# Patient Record
Sex: Male | Born: 1955 | Race: White | Hispanic: No | Marital: Married | Smoking: Never smoker
Health system: Southern US, Community
[De-identification: ages and names within clinical notes are randomized; demographics above are authoritative.]

## PROBLEM LIST (undated history)

## (undated) DIAGNOSIS — M48062 Spinal stenosis, lumbar region with neurogenic claudication: Secondary | ICD-10-CM

## (undated) DIAGNOSIS — M1990 Unspecified osteoarthritis, unspecified site: Secondary | ICD-10-CM

## (undated) DIAGNOSIS — R519 Headache, unspecified: Secondary | ICD-10-CM

## (undated) DIAGNOSIS — R51 Headache: Secondary | ICD-10-CM

---

## 2007-06-02 ENCOUNTER — Ambulatory Visit (HOSPITAL_COMMUNITY): Admission: RE | Admit: 2007-06-02 | Discharge: 2007-06-03 | Payer: Self-pay | Admitting: Neurosurgery

## 2010-07-11 NOTE — Op Note (Signed)
NAME:  CLANCE, BAQUERO NO.:  [id number]   MEDICAL RECORD NO.:  [id number]          PATIENT TYPE:  OIB   LOCATION:  3523                         FACILITY:  [facility]   PHYSICIAN:  Hewitt Shorts, M.D.DATE OF BIRTH:  1955-10-01   DATE OF PROCEDURE:  DATE OF DISCHARGE:                               OPERATIVE REPORT   PREOPERATIVE DIAGNOSES:  1. C5-C6 diffused, C6-C7cervical disk herniation.  2. Cervical spondylosis.  3. Cervical degenerative disk disease.  4. Right cervical radiculopathy and cervical myelopathy.   POSTOPERATIVE DIAGNOSES:  1. C5-C6 diffused, C6-C7cervical disk herniation.  2. Cervical spondylosis.  3. Cervical degenerative disk disease.  4. Right cervical radiculopathy and cervical myelopathy.   PROCEDURE:  C5-C6 and C6-C7 anterior cervical decompression and  arthrodesis with allograft and theater cervical plating.   SURGEON:  Hewitt Shorts, M.D.   ASSISTANT:  Lauree Chandler, NP and Coletta Memos, M.D.   ANESTHESIA:  General endotracheal.   INDICATIONS FOR PROCEDURE:  The patient is a 55 year old man who  presented with right cervical radiculopathy and significant small disk  herniations at C5-C6 and C6-C7.  There is an area of increased signal in  the spinal cord at the C5-C6 level consistent with myelopathy and  consideration this would be a two level decompression and arthrodesis.   PROCEDURE IN DETAIL:  The patient was brought to the operating room and  placed under general endotracheal anesthesia.  The patient was placed in  10 lbs of Holter traction.  Neck was prepped with Betadine soaked  solution in standard sterile fashion.  A horizontal incision was made in  the left side of the neck.  The line of the incision was infiltrated  with local anesthetic with epinephrine.  Incision was made and carried  down through the subcutaneous tissue and platysma.  Bipolar  electrocautery was used to maintain hemostasis.  Dissection was then  carried out through an  avascular plane leaving the sternocleidomastoid,  carotid artery, jugular vein laterally and the trachea and esophagus  medially.  The ventral aspect of the vertebral column was identified and  localization taken in the C5-C6, C6-C7, intervertebral disk space was  identified.  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  Anterior osteophytic  overgrowth was carefully removed.  Then, the microscope was draped and  brought to the field to provide additional navigation, illumination, and  visualization.  The remainder of the decompression was performed using  microdissection and microsurgical technique.   Cauterized end plates of the corresponding vertebrae were removed using  microcurette along with the X-Max drill and then posterior osteophytic  overgrowth was removed at each level using the using the X-Max drill and  along with the 2-mm Kerrison punch with a thin foot plate.  There was  significant spondylitic disk herniation at each level.  This was  carefully removed.  We removed posterior longitudinal ligament and we  were able to decompress the thecal sac and spinal canal at each level.  We then examined the neural foramina and carefully removed spondylotic  overgrowth and decompressed the  exiting C6 and C7 nerve roots  bilaterally and the decompression was completed.  Hemostasis was  established with the use of Gelfoam soaked in thrombin.  We measured the  height of the intervertebral disk spaces and selected two 7-mm implants.  Each of the allograft implants were saturated with saline solution and  then positioned in the intervertebral disk space and countersunk.  We  then selected a 32-mm theater cervical plate was positioned was  positioned over the fusion concert and secured to the vertebral with 4-  mm valve like screws placing a pair of 14-mm screws at C5, single 14 mm  screw at C6, a pair of  15-mm screws at C7.  Each screw hole was drilled  intact with the screws placed in alternating fashion.  Final tightening  was performed of all 5 screws.  An x-ray was taken.  We could visualize  the screws at C5, which were in good position and bone graft at C5-C6.  The remainder of the construct was obscured by the patient's shoulder,  but under direct visualization in the operating room, we were able to  see the graft.  plate and screws, and all were in good position.  The  wound was irrigated with bacitracin solution.  Checked for hemostasis,  which was established and confirmed and then we proceeded with closure.  The platysma was closed with interrupted inverted 2-0 Vicryl sutures.  The subcutaneous tissue was closed with interrupted inverted 3-0 Vicryl  sutures.  The skin was re-approximated with Dermabond.  The procedure  was tolerated well.  Estimated blood loss was 50 mL.  Sponge and  instrument count were correct.  Following surgery, the patient was taken  out of cervical traction and placed a soft cervical collar, reversed  from the anesthetic, extubated, and transferred to the recovery room for  further care.  He was noted to be moving all 4 extremities to command.      Hewitt Shorts, M.D.  Electronically Signed     RWN/MEDQ  D:  06/02/2007  T:  06/03/2007  Job:  [id number]

## 2010-11-21 LAB — BASIC METABOLIC PANEL
BUN: 13
CO2: 26
Calcium: 8.9
Chloride: 106
Creatinine, Ser: 1.25
GFR calc Af Amer: >60
GFR calc non Af Amer: >60
Glucose, Bld: 106 — ABNORMAL HIGH
Potassium: 4.2
Sodium: 139

## 2010-11-21 LAB — CBC
HCT: 42.3
Hemoglobin: 14.8
MCHC: 35
MCV: 95.2
Platelets: 203
RBC: 4.45
RDW: 12.4
WBC: 8.1

## 2011-02-27 HISTORY — PX: CERVICAL SPINE SURGERY: SHX589

## 2017-02-26 HISTORY — PX: LUMBAR MICRODISCECTOMY: SHX99

## 2017-03-07 ENCOUNTER — Other Ambulatory Visit: Payer: Self-pay | Admitting: Neurosurgery

## 2017-03-07 DIAGNOSIS — S129XXA Fracture of neck, unspecified, initial encounter: Secondary | ICD-10-CM

## 2017-03-28 ENCOUNTER — Ambulatory Visit
Admission: RE | Admit: 2017-03-28 | Discharge: 2017-03-28 | Disposition: A | Discharge status: Home / Self Care | Payer: 59 | Source: Ambulatory Visit | Attending: Neurosurgery | Admitting: Neurosurgery

## 2017-03-28 DIAGNOSIS — S129XXA Fracture of neck, unspecified, initial encounter: Secondary | ICD-10-CM

## 2017-08-20 ENCOUNTER — Other Ambulatory Visit: Payer: Self-pay | Admitting: Neurosurgery

## 2017-08-20 DIAGNOSIS — M5416 Radiculopathy, lumbar region: Secondary | ICD-10-CM

## 2017-08-21 ENCOUNTER — Ambulatory Visit
Admission: RE | Admit: 2017-08-21 | Discharge: 2017-08-21 | Disposition: A | Discharge status: Home / Self Care | Payer: 59 | Source: Ambulatory Visit | Attending: Neurosurgery | Admitting: Neurosurgery

## 2017-08-21 DIAGNOSIS — M5416 Radiculopathy, lumbar region: Secondary | ICD-10-CM

## 2017-08-22 ENCOUNTER — Other Ambulatory Visit: Payer: Self-pay | Admitting: Neurosurgery

## 2017-08-26 ENCOUNTER — Other Ambulatory Visit: Payer: Self-pay | Admitting: Neurosurgery

## 2017-09-02 NOTE — Pre-Procedure Instructions (Signed)
Aaron Parks  09/02/2017     No Pharmacies Listed   Your procedure is scheduled on  Thursday 09/05/17  [facility]  Call this number if you have problems the morning of surgery:  [phone]   Remember:  Do not eat or drink after midnight.     Take these medicines the morning of surgery with A SIP OF WATER   7 days prior to surgery STOP taking any Aspirin(unless otherwise instructed by your surgeon), Aleve, Naproxen, Ibuprofen, Motrin, Advil, Goody's, BC's, all herbal medications, fish oil, and all vitamins   Do not wear jewelry, make-up or nail polish.  Do not wear lotions, powders, or perfumes, or deodorant.  Do not shave 48 hours prior to surgery.  Men may shave face and neck.  Do not bring valuables to the hospital.  [facility] for any belongings or valuables.  Contacts, dentures or bridgework may not be worn into surgery.  Leave your suitcase in the car.  After surgery it may be brought to your room.  For patients admitted to the hospital, discharge time will be determined by your treatment team.  Patients discharged the day of surgery will not be allowed to drive home.   Name and phone number of your driver:    Special instructions:  [name] - Preparing for Surgery  Before surgery, you can play an important role.  Because skin is not sterile, your skin needs to be as free of germs as possible.  You can reduce the number of germs on you skin by washing with CHG (chlorahexidine gluconate) soap before surgery.  CHG is an antiseptic cleaner which kills germs and bonds with the skin to continue killing germs even after washing.  Oral Hygiene is also important in reducing the risk of infection.  Remember to brush your teeth with your regular toothpaste the morning of surgery.  Please DO NOT use if you have an allergy to CHG or antibacterial soaps.  If your skin becomes reddened/irritated stop using the CHG  and inform your nurse when you arrive at Short Stay.  Do not shave (including legs and underarms) for at least 48 hours prior to the first CHG shower.  You may shave your face.  Please follow these instructions carefully:   1.  Shower with CHG Soap the night before surgery and the morning of Surgery.  2.  If you choose to wash your hair, wash your hair first as usual with your normal shampoo.  3.  After you shampoo, rinse your hair and body thoroughly to remove the shampoo. 4.  Use CHG as you would any other liquid soap.  You can apply chg directly to the skin and wash gently with a      scrungie or washcloth.           5.  Apply the CHG Soap to your body ONLY FROM THE NECK DOWN.   Do not use on open wounds or open sores. Avoid contact with your eyes, ears, mouth and genitals (private parts).  Wash genitals (private parts) with your normal soap.  6.  Wash thoroughly, paying special attention to the area where your surgery will be performed.  7.  Thoroughly rinse your body with warm water from the neck down.  8.  DO NOT shower/wash with your normal soap after using and rinsing off the CHG Soap.  9.  Pat yourself dry with a clean  towel.            10.  Wear clean pajamas.            11.  Place clean sheets on your bed the night of your first shower and do not sleep with pets.  Day of Surgery  Do not apply any lotions/deoderants the morning of surgery.   Please wear clean clothes to the hospital/surgery center. Remember to brush your teeth with toothpaste.     Please read over the following fact sheets that you were given. Pain Booklet, MRSA Information and Surgical Site Infection Prevention

## 2017-09-03 ENCOUNTER — Other Ambulatory Visit: Payer: Self-pay

## 2017-09-03 ENCOUNTER — Encounter (HOSPITAL_COMMUNITY)
Admission: RE | Admit: 2017-09-03 | Discharge: 2017-09-03 | Disposition: A | Discharge status: Home / Self Care | Payer: 59 | Source: Ambulatory Visit | Attending: Neurosurgery | Admitting: Neurosurgery

## 2017-09-03 ENCOUNTER — Encounter (HOSPITAL_COMMUNITY): Payer: Self-pay

## 2017-09-03 DIAGNOSIS — Z01812 Encounter for preprocedural laboratory examination: Secondary | ICD-10-CM

## 2017-09-03 HISTORY — DX: Unspecified osteoarthritis, unspecified site: M19.90

## 2017-09-03 LAB — COMPREHENSIVE METABOLIC PANEL
ALK PHOS: 57 U/L (ref 38–126)
ALT: 26 U/L (ref 0–44)
AST: 23 U/L (ref 15–41)
Albumin: 4 g/dL (ref 3.5–5.0)
Anion gap: 11 (ref 5–15)
BILIRUBIN TOTAL: 0.9 mg/dL (ref 0.3–1.2)
BUN: 17 mg/dL (ref 8–23)
CALCIUM: 9.2 mg/dL (ref 8.9–10.3)
CO2: 23 mmol/L (ref 22–32)
CREATININE: 0.97 mg/dL (ref 0.61–1.24)
Chloride: 108 mmol/L (ref 98–111)
GFR calc Af Amer: >60 mL/min (ref >60)
Glucose, Bld: 98 mg/dL (ref 70–99)
POTASSIUM: 4.1 mmol/L (ref 3.5–5.1)
Sodium: 142 mmol/L (ref 135–145)
TOTAL PROTEIN: 6.5 g/dL (ref 6.5–8.1)

## 2017-09-03 LAB — SURGICAL PCR SCREEN
MRSA, PCR: POSITIVE — AB
Staphylococcus aureus: POSITIVE — AB

## 2017-09-03 LAB — CBC
HCT: 46.8 % (ref 39.0–52.0)
Hemoglobin: 15.9 g/dL (ref 13.0–17.0)
MCH: 33.1 pg (ref 26.0–34.0)
MCHC: 34 g/dL (ref 30.0–36.0)
MCV: 97.5 fL (ref 78.0–100.0)
PLATELETS: 178 10*3/uL (ref 150–400)
RBC: 4.8 MIL/uL (ref 4.22–5.81)
RDW: 12.4 % (ref 11.5–15.5)
WBC: 7.4 10*3/uL (ref 4.0–10.5)

## 2017-09-03 LAB — ABO/RH: ABO/RH(D): B POS

## 2017-09-03 LAB — TYPE AND SCREEN
ABO/RH(D): B POS
Antibody Screen: NEGATIVE

## 2017-09-03 NOTE — Progress Notes (Signed)
Pt with positive MRSA nasal PCR swab.  Treat with Betadine DOS per protocol and place on contact precautions.

## 2017-09-03 NOTE — Pre-Procedure Instructions (Signed)
Aaron DenmarkJohn T Parks  09/03/2017       Your procedure is scheduled on  Thursday 09/05/17  [facility] at 530 A.M.  Call this number if you have problems the morning of surgery:  [phone]   Remember:  Do not eat or drink after midnight.     Take these medicines the morning of surgery with A SIP OF WATER: Tylenol if needed Gabapentin (Neurontin) Eye drops if needed   7 days prior to surgery STOP taking any Aspirin, Aleve, Naproxen, Ibuprofen, Motrin, Advil, Goody's, BC's, all herbal medications, fish oil, and all vitamins.   Do not wear jewelry.  Do not wear lotions, powders, or colognes, or deodorant.  Do not shave 48 hours prior to surgery.  Men may shave face and neck.  Do not bring valuables to the hospital.  [facility] is not responsible for any belongings or valuables.  Contacts, dentures or bridgework may not be worn into surgery.  Leave your suitcase in the car.  After surgery it may be brought to your room.  For patients admitted to the hospital, discharge time will be determined by your treatment team.  Patients discharged the day of surgery will not be allowed to drive home.    Special instructions:  [place]- Preparing For Surgery  Before surgery, you can play an important role. Because skin is not sterile, your skin needs to be as free of germs as possible. You can reduce the number of germs on your skin by washing with CHG (chlorahexidine gluconate) Soap before surgery.  CHG is an antiseptic cleaner which kills germs and bonds with the skin to continue killing germs even after washing.    Oral Hygiene is also important to reduce your risk of infection.  Remember - BRUSH YOUR TEETH THE MORNING OF SURGERY WITH YOUR REGULAR TOOTHPASTE  Please do not use if you have an allergy to CHG or antibacterial soaps. If your skin becomes reddened/irritated stop using the CHG.  Do not shave (including legs and underarms) for at least 48 hours  prior to first CHG shower. It is OK to shave your face.  Please follow these instructions carefully.   1. Shower the NIGHT BEFORE SURGERY and the MORNING OF SURGERY with CHG.   2. If you chose to wash your hair, wash your hair first as usual with your normal shampoo.  3. After you shampoo, rinse your hair and body thoroughly to remove the shampoo.  4. Use CHG as you would any other liquid soap. You can apply CHG directly to the skin and wash gently with a scrungie or a clean washcloth.   5. Apply the CHG Soap to your body ONLY FROM THE NECK DOWN.  Do not use on open wounds or open sores. Avoid contact with your eyes, ears, mouth and genitals (private parts). Wash Face and genitals (private parts)  with your normal soap.  6. Wash thoroughly, paying special attention to the area where your surgery will be performed.  7. Thoroughly rinse your body with warm water from the neck down.  8. DO NOT shower/wash with your normal soap after using and rinsing off the CHG Soap.  9. Pat yourself dry with a CLEAN TOWEL.  10. Wear CLEAN PAJAMAS to bed the night before surgery, wear comfortable clothes the morning of surgery  11. Place CLEAN SHEETS on your bed the night of your first shower and DO NOT SLEEP WITH PETS.    Day  of Surgery:  Do not apply any deodorants/lotions.  Please wear clean clothes to the hospital/surgery center.   Remember to brush your teeth WITH YOUR REGULAR TOOTHPASTE.      Please read over the following fact sheets that you were given.

## 2017-09-03 NOTE — Progress Notes (Signed)
PCP: Denies Cardiologist: Denies   Denies any EKG, CXR, ECHO, Stress Test, Cardiac Cath  Patient denies shortness of breath, fever, cough, and chest pain at PAT appointment.  Patient verbalized understanding of instructions provided today at the PAT appointment.  Patient asked to review instructions at home and day of surgery.

## 2017-09-05 ENCOUNTER — Inpatient Hospital Stay (HOSPITAL_COMMUNITY): Payer: 59

## 2017-09-05 ENCOUNTER — Inpatient Hospital Stay (HOSPITAL_COMMUNITY)
Admission: RE | Admit: 2017-09-05 | Discharge: 2017-09-06 | DRG: 460 | Disposition: A | Discharge status: Home / Self Care | Payer: 59 | Source: Ambulatory Visit | Attending: Neurosurgery | Admitting: Neurosurgery

## 2017-09-05 ENCOUNTER — Other Ambulatory Visit: Payer: Self-pay

## 2017-09-05 ENCOUNTER — Encounter (HOSPITAL_COMMUNITY): Payer: Self-pay | Admitting: *Deleted

## 2017-09-05 ENCOUNTER — Inpatient Hospital Stay (HOSPITAL_COMMUNITY): Payer: 59 | Admitting: Certified Registered"

## 2017-09-05 ENCOUNTER — Encounter (HOSPITAL_COMMUNITY)
Admission: RE | Disposition: A | Discharge status: Home / Self Care | Payer: Self-pay | Source: Ambulatory Visit | Attending: Neurosurgery

## 2017-09-05 DIAGNOSIS — M545 Low back pain: Secondary | ICD-10-CM | POA: Diagnosis present

## 2017-09-05 DIAGNOSIS — M4726 Other spondylosis with radiculopathy, lumbar region: Secondary | ICD-10-CM | POA: Diagnosis present

## 2017-09-05 DIAGNOSIS — M5136 Other intervertebral disc degeneration, lumbar region: Secondary | ICD-10-CM | POA: Diagnosis present

## 2017-09-05 DIAGNOSIS — M4316 Spondylolisthesis, lumbar region: Secondary | ICD-10-CM | POA: Diagnosis present

## 2017-09-05 DIAGNOSIS — M5126 Other intervertebral disc displacement, lumbar region: Secondary | ICD-10-CM | POA: Diagnosis present

## 2017-09-05 DIAGNOSIS — M48062 Spinal stenosis, lumbar region with neurogenic claudication: Principal | ICD-10-CM | POA: Diagnosis present

## 2017-09-05 DIAGNOSIS — Z419 Encounter for procedure for purposes other than remedying health state, unspecified: Secondary | ICD-10-CM

## 2017-09-05 SURGERY — POSTERIOR LUMBAR FUSION 1 LEVEL
Anesthesia: General | Site: Back

## 2017-09-05 MED ORDER — CYCLOBENZAPRINE HCL 5 MG PO TABS: 5.0000 mg | ORAL_TABLET | Freq: Three times a day (TID) | ORAL | Status: DC | PRN

## 2017-09-05 MED ORDER — SODIUM CHLORIDE 0.9% FLUSH: 3.0000 mL | INTRAVENOUS | Status: DC | PRN

## 2017-09-05 MED ORDER — DEXAMETHASONE SODIUM PHOSPHATE 10 MG/ML IJ SOLN
INTRAMUSCULAR | Status: AC
  Filled 2017-09-05: qty 1

## 2017-09-05 MED ORDER — KETOROLAC TROMETHAMINE 30 MG/ML IJ SOLN
INTRAMUSCULAR | Status: AC
  Filled 2017-09-05: qty 1

## 2017-09-05 MED ORDER — VANCOMYCIN HCL IN DEXTROSE 1-5 GM/200ML-% IV SOLN
1000.0000 mg | INTRAVENOUS | Status: AC
  Administered 2017-09-05: 1000 mg via INTRAVENOUS

## 2017-09-05 MED ORDER — FENTANYL CITRATE (PF) 100 MCG/2ML IJ SOLN
INTRAMUSCULAR | Status: DC | PRN
  Administered 2017-09-05: 150 ug via INTRAVENOUS
  Administered 2017-09-05 (×8): 50 ug via INTRAVENOUS

## 2017-09-05 MED ORDER — OXYCODONE HCL 5 MG/5ML PO SOLN: 5.0000 mg | Freq: Once | ORAL | Status: DC | PRN

## 2017-09-05 MED ORDER — 0.9 % SODIUM CHLORIDE (POUR BTL) OPTIME
TOPICAL | Status: DC | PRN
  Administered 2017-09-05: 2000 mL

## 2017-09-05 MED ORDER — HYDROXYZINE HCL 50 MG/ML IM SOLN
50.0000 mg | INTRAMUSCULAR | Status: DC | PRN
  Administered 2017-09-06: 50 mg via INTRAMUSCULAR
  Filled 2017-09-05: qty 1

## 2017-09-05 MED ORDER — KETOROLAC TROMETHAMINE 30 MG/ML IJ SOLN
30.0000 mg | Freq: Once | INTRAMUSCULAR | Status: AC
  Administered 2017-09-05: 30 mg via INTRAVENOUS

## 2017-09-05 MED ORDER — EPHEDRINE SULFATE 50 MG/ML IJ SOLN
INTRAMUSCULAR | Status: DC | PRN
  Administered 2017-09-05: 10 mg via INTRAVENOUS

## 2017-09-05 MED ORDER — LACTATED RINGERS IV SOLN
INTRAVENOUS | Status: DC | PRN
  Administered 2017-09-05 (×3): via INTRAVENOUS

## 2017-09-05 MED ORDER — HEMOSTATIC AGENTS (NO CHARGE) OPTIME
TOPICAL | Status: DC | PRN
  Administered 2017-09-05: 1

## 2017-09-05 MED ORDER — THROMBIN 20000 UNITS EX SOLR
CUTANEOUS | Status: AC
  Filled 2017-09-05: qty 20000

## 2017-09-05 MED ORDER — CHLORHEXIDINE GLUCONATE CLOTH 2 % EX PADS: 6.0000 | MEDICATED_PAD | Freq: Once | CUTANEOUS | Status: DC

## 2017-09-05 MED ORDER — LIDOCAINE-EPINEPHRINE 1 %-1:100000 IJ SOLN
INTRAMUSCULAR | Status: AC
  Filled 2017-09-05: qty 1

## 2017-09-05 MED ORDER — MENTHOL 3 MG MT LOZG: 1.0000 | LOZENGE | OROMUCOSAL | Status: DC | PRN

## 2017-09-05 MED ORDER — MIDAZOLAM HCL 5 MG/5ML IJ SOLN
INTRAMUSCULAR | Status: DC | PRN
  Administered 2017-09-05: 2 mg via INTRAVENOUS

## 2017-09-05 MED ORDER — MIDAZOLAM HCL 2 MG/2ML IJ SOLN
INTRAMUSCULAR | Status: AC
  Filled 2017-09-05: qty 2

## 2017-09-05 MED ORDER — FLEET ENEMA 7-19 GM/118ML RE ENEM: 1.0000 | ENEMA | Freq: Once | RECTAL | Status: DC | PRN

## 2017-09-05 MED ORDER — PROPOFOL 10 MG/ML IV BOLUS
INTRAVENOUS | Status: DC | PRN
  Administered 2017-09-05: 100 mg via INTRAVENOUS
  Administered 2017-09-05: 40 mg via INTRAVENOUS
  Administered 2017-09-05: 30 mg via INTRAVENOUS
  Administered 2017-09-05: 130 mg via INTRAVENOUS

## 2017-09-05 MED ORDER — HYDROXYZINE HCL 25 MG PO TABS: 50.0000 mg | ORAL_TABLET | ORAL | Status: DC | PRN

## 2017-09-05 MED ORDER — FENTANYL CITRATE (PF) 250 MCG/5ML IJ SOLN
INTRAMUSCULAR | Status: AC
  Filled 2017-09-05: qty 5

## 2017-09-05 MED ORDER — BUPIVACAINE HCL (PF) 0.5 % IJ SOLN
INTRAMUSCULAR | Status: AC
  Filled 2017-09-05: qty 30

## 2017-09-05 MED ORDER — OXYCODONE HCL 5 MG PO TABS: 5.0000 mg | ORAL_TABLET | Freq: Once | ORAL | Status: DC | PRN

## 2017-09-05 MED ORDER — FENTANYL CITRATE (PF) 100 MCG/2ML IJ SOLN
INTRAMUSCULAR | Status: DC | PRN
  Administered 2017-09-05: 100 ug via INTRAVENOUS

## 2017-09-05 MED ORDER — KETOROLAC TROMETHAMINE 30 MG/ML IJ SOLN
30.0000 mg | Freq: Four times a day (QID) | INTRAMUSCULAR | Status: DC
  Administered 2017-09-05 – 2017-09-06 (×3): 30 mg via INTRAVENOUS
  Filled 2017-09-05 (×3): qty 1

## 2017-09-05 MED ORDER — PHENYLEPHRINE HCL 10 MG/ML IJ SOLN
INTRAVENOUS | Status: DC | PRN
  Administered 2017-09-05: 30 ug/min via INTRAVENOUS

## 2017-09-05 MED ORDER — ALUM & MAG HYDROXIDE-SIMETH 200-200-20 MG/5ML PO SUSP: 30.0000 mL | Freq: Four times a day (QID) | ORAL | Status: DC | PRN

## 2017-09-05 MED ORDER — MUPIROCIN 2 % EX OINT
1.0000 "application " | TOPICAL_OINTMENT | Freq: Two times a day (BID) | CUTANEOUS | Status: DC
  Administered 2017-09-05 – 2017-09-06 (×2): 1 via NASAL
  Filled 2017-09-05: qty 22

## 2017-09-05 MED ORDER — SODIUM CHLORIDE 0.9% FLUSH
3.0000 mL | Freq: Two times a day (BID) | INTRAVENOUS | Status: DC
  Administered 2017-09-05: 3 mL via INTRAVENOUS

## 2017-09-05 MED ORDER — ONDANSETRON HCL 4 MG/2ML IJ SOLN
INTRAMUSCULAR | Status: DC | PRN
  Administered 2017-09-05: 4 mg via INTRAVENOUS

## 2017-09-05 MED ORDER — KCL IN DEXTROSE-NACL 20-5-0.45 MEQ/L-%-% IV SOLN: INTRAVENOUS | Status: DC

## 2017-09-05 MED ORDER — ALBUTEROL SULFATE HFA 108 (90 BASE) MCG/ACT IN AERS
INHALATION_SPRAY | RESPIRATORY_TRACT | Status: DC | PRN
  Administered 2017-09-05 (×4): 2 via RESPIRATORY_TRACT

## 2017-09-05 MED ORDER — FENTANYL CITRATE (PF) 100 MCG/2ML IJ SOLN
INTRAMUSCULAR | Status: AC
Start: 2017-09-05 — End: ?
  Filled 2017-09-05: qty 2

## 2017-09-05 MED ORDER — MORPHINE SULFATE (PF) 4 MG/ML IV SOLN: 4.0000 mg | INTRAVENOUS | Status: DC | PRN

## 2017-09-05 MED ORDER — SODIUM CHLORIDE 0.9 % IV SOLN
INTRAVENOUS | Status: DC | PRN
  Administered 2017-09-05: 500 mL

## 2017-09-05 MED ORDER — SUGAMMADEX SODIUM 200 MG/2ML IV SOLN
INTRAVENOUS | Status: AC
  Filled 2017-09-05: qty 2

## 2017-09-05 MED ORDER — BUPIVACAINE HCL (PF) 0.5 % IJ SOLN
INTRAMUSCULAR | Status: DC | PRN
  Administered 2017-09-05: 15 mL

## 2017-09-05 MED ORDER — CHLORHEXIDINE GLUCONATE CLOTH 2 % EX PADS
6.0000 | MEDICATED_PAD | Freq: Every day | CUTANEOUS | Status: DC
  Administered 2017-09-06: 6 via TOPICAL

## 2017-09-05 MED ORDER — SODIUM CHLORIDE 0.9 % IV SOLN: 250.0000 mL | INTRAVENOUS | Status: DC

## 2017-09-05 MED ORDER — MAGNESIUM HYDROXIDE 400 MG/5ML PO SUSP: 30.0000 mL | Freq: Every day | ORAL | Status: DC | PRN

## 2017-09-05 MED ORDER — SUCCINYLCHOLINE CHLORIDE 20 MG/ML IJ SOLN
INTRAMUSCULAR | Status: DC | PRN
  Administered 2017-09-05: 60 mg via INTRAVENOUS

## 2017-09-05 MED ORDER — HYDROCODONE-ACETAMINOPHEN 5-325 MG PO TABS
1.0000 | ORAL_TABLET | ORAL | Status: DC | PRN
  Administered 2017-09-05 – 2017-09-06 (×3): 1 via ORAL
  Filled 2017-09-05 (×4): qty 1

## 2017-09-05 MED ORDER — ONDANSETRON HCL 4 MG/2ML IJ SOLN
INTRAMUSCULAR | Status: AC
  Filled 2017-09-05: qty 2

## 2017-09-05 MED ORDER — CEFAZOLIN SODIUM-DEXTROSE 2-4 GM/100ML-% IV SOLN
2.0000 g | INTRAVENOUS | Status: AC
  Administered 2017-09-05: 2 g via INTRAVENOUS
  Filled 2017-09-05: qty 100

## 2017-09-05 MED ORDER — METHYLPREDNISOLONE ACETATE 80 MG/ML IJ SUSP
INTRAMUSCULAR | Status: DC | PRN
  Administered 2017-09-05: 80 mg

## 2017-09-05 MED ORDER — METHYLPREDNISOLONE ACETATE 80 MG/ML IJ SUSP
INTRAMUSCULAR | Status: AC
  Filled 2017-09-05: qty 1

## 2017-09-05 MED ORDER — DEXAMETHASONE SODIUM PHOSPHATE 10 MG/ML IJ SOLN
INTRAMUSCULAR | Status: DC | PRN
  Administered 2017-09-05: 10 mg via INTRAVENOUS

## 2017-09-05 MED ORDER — LIDOCAINE HCL (CARDIAC) PF 100 MG/5ML IV SOSY
PREFILLED_SYRINGE | INTRAVENOUS | Status: DC | PRN
  Administered 2017-09-05: 60 mg via INTRAVENOUS

## 2017-09-05 MED ORDER — GABAPENTIN 300 MG PO CAPS
300.0000 mg | ORAL_CAPSULE | Freq: Two times a day (BID) | ORAL | Status: DC
  Administered 2017-09-05 – 2017-09-06 (×3): 300 mg via ORAL
  Filled 2017-09-05 (×3): qty 1

## 2017-09-05 MED ORDER — LIDOCAINE-EPINEPHRINE 1 %-1:100000 IJ SOLN
INTRAMUSCULAR | Status: DC | PRN
  Administered 2017-09-05: 15 mL

## 2017-09-05 MED ORDER — HYDROMORPHONE HCL 1 MG/ML IJ SOLN: 0.2500 mg | INTRAMUSCULAR | Status: DC | PRN

## 2017-09-05 MED ORDER — ACETAMINOPHEN 325 MG PO TABS: 650.0000 mg | ORAL_TABLET | ORAL | Status: DC | PRN

## 2017-09-05 MED ORDER — PROPOFOL 10 MG/ML IV BOLUS
INTRAVENOUS | Status: AC
  Filled 2017-09-05: qty 20

## 2017-09-05 MED ORDER — ACETAMINOPHEN 650 MG RE SUPP: 650.0000 mg | RECTAL | Status: DC | PRN

## 2017-09-05 MED ORDER — ACETAMINOPHEN 10 MG/ML IV SOLN
INTRAVENOUS | Status: AC
  Filled 2017-09-05: qty 100

## 2017-09-05 MED ORDER — PHENOL 1.4 % MT LIQD: 1.0000 | OROMUCOSAL | Status: DC | PRN

## 2017-09-05 MED ORDER — VANCOMYCIN HCL IN DEXTROSE 1-5 GM/200ML-% IV SOLN
INTRAVENOUS | Status: AC
  Administered 2017-09-05: 1000 mg via INTRAVENOUS
  Filled 2017-09-05: qty 200

## 2017-09-05 MED ORDER — ACETAMINOPHEN 10 MG/ML IV SOLN
INTRAVENOUS | Status: DC | PRN
  Administered 2017-09-05: 1000 mg via INTRAVENOUS

## 2017-09-05 MED ORDER — ROCURONIUM BROMIDE 10 MG/ML (PF) SYRINGE
PREFILLED_SYRINGE | INTRAVENOUS | Status: AC
  Filled 2017-09-05: qty 10

## 2017-09-05 MED ORDER — LIDOCAINE 2% (20 MG/ML) 5 ML SYRINGE
INTRAMUSCULAR | Status: AC
  Filled 2017-09-05: qty 5

## 2017-09-05 MED ORDER — BISACODYL 10 MG RE SUPP: 10.0000 mg | Freq: Every day | RECTAL | Status: DC | PRN

## 2017-09-05 MED ORDER — THROMBIN 20000 UNITS EX SOLR
CUTANEOUS | Status: DC | PRN
  Administered 2017-09-05: 09:00:00 via TOPICAL

## 2017-09-05 MED ORDER — PROPOFOL 500 MG/50ML IV EMUL
INTRAVENOUS | Status: DC | PRN
  Administered 2017-09-05: 25 ug/kg/min via INTRAVENOUS
  Administered 2017-09-05: 11:00:00 via INTRAVENOUS

## 2017-09-05 MED FILL — Heparin Sodium (Porcine) Inj 1000 Unit/ML: INTRAMUSCULAR | Qty: 30 | Status: AC

## 2017-09-05 MED FILL — Sodium Chloride IV Soln 0.9%: INTRAVENOUS | Qty: 1000 | Status: AC

## 2017-09-05 SURGICAL SUPPLY — 72 items
BAG DECANTER FOR FLEXI CONT (MISCELLANEOUS) ×2 IMPLANT
BENZOIN TINCTURE PRP APPL 2/3 (GAUZE/BANDAGES/DRESSINGS) IMPLANT
BLADE CLIPPER SURG (BLADE) IMPLANT
BUR ACRON 5.0MM COATED (BURR) ×2 IMPLANT
BUR MATCHSTICK NEURO 3.0 LAGG (BURR) ×2 IMPLANT
CANISTER SUCT 3000ML PPV (MISCELLANEOUS) ×2 IMPLANT
CAP RELINE MOD TULIP RMM (Cap) ×8 IMPLANT
CARTRIDGE OIL MAESTRO DRILL (MISCELLANEOUS) ×1 IMPLANT
CLIP NEUROVISION LG (CLIP) ×2 IMPLANT
CONT SPEC 4OZ CLIKSEAL STRL BL (MISCELLANEOUS) ×2 IMPLANT
COVER BACK TABLE 60X90IN (DRAPES) ×2 IMPLANT
DECANTER SPIKE VIAL GLASS SM (MISCELLANEOUS) ×4 IMPLANT
DERMABOND ADVANCED (GAUZE/BANDAGES/DRESSINGS) ×1
DERMABOND ADVANCED .7 DNX12 (GAUZE/BANDAGES/DRESSINGS) ×1 IMPLANT
DIFFUSER DRILL AIR PNEUMATIC (MISCELLANEOUS) ×2 IMPLANT
DRAPE C-ARM 42X72 X-RAY (DRAPES) ×2 IMPLANT
DRAPE C-ARMOR (DRAPES) ×2 IMPLANT
DRAPE HALF SHEET 40X57 (DRAPES) ×2 IMPLANT
DRAPE LAPAROTOMY 100X72X124 (DRAPES) ×2 IMPLANT
DRAPE MICROSCOPE LEICA (MISCELLANEOUS) ×2 IMPLANT
DRAPE POUCH INSTRU U-SHP 10X18 (DRAPES) ×2 IMPLANT
ELECT REM PT RETURN 9FT ADLT (ELECTROSURGICAL) ×2
ELECTRODE REM PT RTRN 9FT ADLT (ELECTROSURGICAL) ×1 IMPLANT
GAUZE SPONGE 4X4 12PLY STRL (GAUZE/BANDAGES/DRESSINGS) ×2 IMPLANT
GAUZE SPONGE 4X4 12PLY STRL LF (GAUZE/BANDAGES/DRESSINGS) ×2 IMPLANT
GAUZE SPONGE 4X4 16PLY XRAY LF (GAUZE/BANDAGES/DRESSINGS) ×2 IMPLANT
GLOVE BIOGEL PI IND STRL 6.5 (GLOVE) ×1 IMPLANT
GLOVE BIOGEL PI IND STRL 8 (GLOVE) ×2 IMPLANT
GLOVE BIOGEL PI INDICATOR 6.5 (GLOVE) ×1
GLOVE BIOGEL PI INDICATOR 8 (GLOVE) ×2
GLOVE ECLIPSE 7.5 STRL STRAW (GLOVE) ×4 IMPLANT
GLOVE SURG SS PI 6.5 STRL IVOR (GLOVE) ×2 IMPLANT
GOWN STRL REUS W/ TWL LRG LVL3 (GOWN DISPOSABLE) ×1 IMPLANT
GOWN STRL REUS W/ TWL XL LVL3 (GOWN DISPOSABLE) ×2 IMPLANT
GOWN STRL REUS W/TWL 2XL LVL3 (GOWN DISPOSABLE) IMPLANT
GOWN STRL REUS W/TWL LRG LVL3 (GOWN DISPOSABLE) ×1
GOWN STRL REUS W/TWL XL LVL3 (GOWN DISPOSABLE) ×2
KIT BASIN OR (CUSTOM PROCEDURE TRAY) ×2 IMPLANT
KIT INFUSE XX SMALL 0.7CC (Orthopedic Implant) ×2 IMPLANT
KIT TURNOVER KIT B (KITS) ×2 IMPLANT
MODULE EMG NEEDLE SSEP NVM5 (NEEDLE) ×2 IMPLANT
MODULE NVM5 NEXT GEN EMG (NEEDLE) ×2 IMPLANT
NEEDLE ASP BONE MRW 8GX15 (NEEDLE) ×2 IMPLANT
NEEDLE HYPO 18GX1.5 BLUNT FILL (NEEDLE) ×2 IMPLANT
NEEDLE SPNL 18GX3.5 QUINCKE PK (NEEDLE) ×2 IMPLANT
NEEDLE SPNL 22GX3.5 QUINCKE BK (NEEDLE) ×2 IMPLANT
NS IRRIG 1000ML POUR BTL (IV SOLUTION) ×2 IMPLANT
OIL CARTRIDGE MAESTRO DRILL (MISCELLANEOUS) ×2
PACK LAMINECTOMY NEURO (CUSTOM PROCEDURE TRAY) ×2 IMPLANT
PAD ARMBOARD 7.5X6 YLW CONV (MISCELLANEOUS) ×6 IMPLANT
PATTIES SURGICAL .5 X.5 (GAUZE/BANDAGES/DRESSINGS) IMPLANT
PATTIES SURGICAL .5 X1 (DISPOSABLE) IMPLANT
PATTIES SURGICAL 1X1 (DISPOSABLE) IMPLANT
ROD RELINE COCR LORD 5.0X35 (Rod) ×4 IMPLANT
SCREW LOCK RSS 4.5/5.0MM (Screw) ×8 IMPLANT
SCREW SHANK RELINE MOD 5.5X30 (Screw) ×4 IMPLANT
SHANK RELINE O MOD 6.5X45 (Screw) ×4 IMPLANT
SPONGE LAP 4X18 RFD (DISPOSABLE) IMPLANT
SPONGE NEURO XRAY DETECT 1X3 (DISPOSABLE) IMPLANT
SPONGE SURGIFOAM ABS GEL 100 (HEMOSTASIS) ×2 IMPLANT
STRIP BIOACTIVE VITOSS 25X52X4 (Orthopedic Implant) ×2 IMPLANT
SUT VIC AB 1 CT1 18XBRD ANBCTR (SUTURE) ×3 IMPLANT
SUT VIC AB 1 CT1 8-18 (SUTURE) ×3
SUT VIC AB 2-0 CP2 18 (SUTURE) ×4 IMPLANT
SYR 3ML LL SCALE MARK (SYRINGE) IMPLANT
SYR 5ML LL (SYRINGE) ×2 IMPLANT
SYR CONTROL 10ML LL (SYRINGE) ×2 IMPLANT
TAPE CLOTH SURG 4X10 WHT LF (GAUZE/BANDAGES/DRESSINGS) ×2 IMPLANT
TOWEL GREEN STERILE (TOWEL DISPOSABLE) ×2 IMPLANT
TOWEL GREEN STERILE FF (TOWEL DISPOSABLE) ×2 IMPLANT
TRAY FOLEY MTR SLVR 16FR STAT (SET/KITS/TRAYS/PACK) ×2 IMPLANT
WATER STERILE IRR 1000ML POUR (IV SOLUTION) ×2 IMPLANT

## 2017-09-05 NOTE — Op Note (Signed)
09/05/2017  12:09 PM  PATIENT:  Aaron FunkJohn T Parks  62 y.o. 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; left L5 lumbar disc herniation; left lumbar radiculopathy; lumbar spondylosis; lumbar degenerative disc disease  PROCEDURE:  Procedure(s):  1) bilateral L3-4 lumbar laminectomy and medial facetectomy with decompression of severe central canal and neuroforaminal stenosis, with decompression of the exiting L3 and L4 nerve roots bilaterally; bilateral L3-4 posterior lateral arthrodesis with nonsegmental Nuvasive Reline posterior instrumentation, locally harvested morselized autograft, Vitoss BA with bone marrow aspirate, and infuse; 2) left L5 hemilaminectomy and microdiscectomy, with microdissection, microsurgical technique, and the operating microscope  SURGEON:  Surgeon(s): Shirlean KellyNudelman, Robert, MD  ANESTHESIA:   general  EBL:  Total I/O In: 2000 [I.V.:2000] Out: 750 [Urine:450; Blood:300]  BLOOD ADMINISTERED:none  CELL SAVER GIVEN: The Cell Saver was used, but there was insufficient processed blood to return to the patient.  COUNT:  Correct per nursing staff  DICTATION: Patient is brought to the operating room placed under general endotracheal anesthesia. The patient was turned to prone position the lumbar region was prepped with Betadine soap and solution and draped in a sterile fashion. The midline was infiltrated with local anesthesia with epinephrine. A localizing x-ray was taken. A midline incision was made carried down through the subcutaneous tissue, bipolar cautery and electrocautery were used to maintain hemostasis.  Dissection was carried down to the lumbar fascia.   We first performed the L3-4 decompression and stabilization.  The fascia was incised bilaterally at the L3-4 level and the paraspinal muscles were dissected with a spinous process and lamina in a subperiosteal fashion. Another x-ray was taken for localization and the L3-4 level was localized. Dissection was then carried out laterally over the L3-4 facet complex.  The decompression was begun with a bilateral laminectomy using double-action rongeurs, the high-speed drill and Kerrison punches.  We found severe canal stenosis, and carefully decompressed the thecal sac.  The dissection was carried out laterally including medial facetectomy and foraminotomies with decompression of the stenotic compression of the exiting L3 and L4 nerve roots bilaterally. Once the decompression stenotic compression of the thecal sac and exiting nerve roots was completed we proceeded with the posterior lateral arthrodesis.  The C-arm fluoroscope was then draped and brought in the field and we identified the pedicle entry points bilaterally at the L3 and L4 levels.  EMG and SSEP monitoring was used throughout the arthrodesis portion of the procedure.  Good elective diagnostic parameters were maintained throughout the instrumentation placement.  We placed cortical screws bilaterally at L3.  Each screw hole was drilled with a 5.5 mm drill, examined with a ball probe and good bony surfaces were found, tapped with a 5.5 mm tap, again examined with a ball probe and again good bony surfaces were found, and 5.5 x 30 mm shanks were placed.  We placed pedicle screws bilaterally at L4.  Each of the pedicles was probed, we did aspirate bone marrow aspirate, which was injected over a 5 cc strip of Vitoss BA.  The probed pedicles were examined with a ball probe and good bony surfaces were found, we then tapped each of the pedicles with a 6.5 mm tap, each screw hole was then again exam with a ball  probe and good bony  surfaces were found, and 6.5 x 45 mm shanks were placed bilaterally.  The L3 facet and lateral laminar surfaces and the L4 facets surfaces were decorticated.  We then secured heads to each of the shanks, and selected 35 mm pre-lordosed rods.  Locally harvested morselized autograft, Vitoss BA with bone marrow aspirate, and infuse was packed over the decorticated bony surfaces of L3 and L4.  We then placed the rods within the screw heads and then secured locking caps.  Once all 4 locking caps were placed, final tightening was performed against a counter torque.  Hemostasis in the epidural space at the L3-4 laminectomy level was established with bipolar cautery, Gelfoam with thrombin, and FloSeal.  We then performed the left L5 hemilaminectomy and microdiscectomy.  Through a separate fascial incision on the left side at the L5 level, we dissected the paraspinal musculature from the spinous process and lamina.  A localizing x-ray was taken, and the L5-S1 interlaminar space was identified.  The operating microscope was draped and brought in the field to provide additional magnification, illumination, and visualization, and the remainder of the microdiscectomy was performed using microdissection microsurgical technique.  A left L5 hemilaminectomy was performed using the high-speed drill and Kerrison punches.  The edges of the bone were waxed as needed to maintain hemostasis.  We carefully remove the ligament of flavum, and exposed the thecal sac.  The annulus of the L5-S1 disc was identified, and we explored rostrally.  We found a free fragment disc herniation near the axilla of the exiting left L5 nerve root.  We freed up the surrounding epidural tissues, and the disc herniation was removed and a piecemeal fashion, with good decompression of the thecal sac and exiting left L5 nerve root.  The epidural space was thoroughly explored, and all loose fragments of disc material were removed.  We then  establish hemostasis with the use of bipolar cautery, Gelfoam with thrombin, and FloSeal.  Once hemostasis was established, we instilled 2 cc of fentanyl and 80 mg of Depo-Medrol into the epidural space.  We then proceeded with closure.  The paraspinal muscles deep fascia and Scarpa's fascia were closed with interrupted undyed 1 Vicryl sutures the subcutaneous and subcuticular closed with interrupted inverted 2-0 undyed Vicryl sutures the skin edges were approximated with Dermabond.  The wound was dressed with sterile gauze and Hypafix  Following surgery the patient was turned back to the supine position to be reversed and the anesthetic extubated and transferred to the recovery room for further care.  PLAN OF CARE: Admit to inpatient   PATIENT DISPOSITION:  PACU - hemodynamically stable.   Delay start of Pharmacological VTE agent (>24hrs) due to surgical blood loss or risk of bleeding:  yes

## 2017-09-05 NOTE — Anesthesia Procedure Notes (Signed)
Procedure Name: Intubation Date/Time: 09/05/2017 7:53 AM Performed by: Rosiland OzMeyers, Karolee Meloni, CRNA Pre-anesthesia Checklist: Patient identified, Emergency Drugs available, Suction available, Patient being monitored and Timeout performed Patient Re-evaluated:Patient Re-evaluated prior to induction Oxygen Delivery Method: Circle system utilized Preoxygenation: Pre-oxygenation with 100% oxygen Induction Type: IV induction Ventilation: Mask ventilation without difficulty Laryngoscope Size: Miller and 3 Grade View: Grade II Tube type: Oral Tube size: 7.5 mm Number of attempts: 1 Airway Equipment and Method: Stylet Placement Confirmation: ETT inserted through vocal cords under direct vision,  positive ETCO2 and breath sounds checked- equal and bilateral Secured at: 21 cm Tube secured with: Tape Dental Injury: Teeth and Oropharynx as per pre-operative assessment

## 2017-09-05 NOTE — Progress Notes (Signed)
Vitals:   09/05/17 1415 09/05/17 1430 09/05/17 1445 09/05/17 1502  BP:  122/71  128/80  Pulse: 68 (!) 59 60 (!) 54  Resp: 16 15 16 17   Temp:      TempSrc:      SpO2: 95% 95% 96% 97%  Weight:      Height:        CBC Recent Labs    09/03/17 1042  WBC 7.4  HGB 15.9  HCT 46.8  PLT 178   BMET Recent Labs    09/03/17 1042  NA 142  K 4.1  CL 108  CO2 23  GLUCOSE 98  BUN 17  CREATININE 0.97  CALCIUM 9.2    Patient resting in bed, comfortable.  Radicular symptoms significantly improved as compared to prior to surgery.  Dressing clean and dry.  Has not yet ambulated in the halls.  Foley to straight drainage, will be removed once ambulating.  Nursing staff at that point will monitor voiding function.  Plan: Encouraged to ambulate.  Continue to progress through postoperative recovery.  Posthospitalization instructions given to patient and his wife.  Hewitt ShortsNUDELMAN,ROBERT W, MD 09/05/2017, 6:02 PM

## 2017-09-05 NOTE — Anesthesia Preprocedure Evaluation (Signed)
Anesthesia Evaluation  Patient identified by MRN, date of birth, ID band Patient awake    Reviewed: Allergy & Precautions, NPO status , Patient's Chart, lab work & pertinent test results  History of Anesthesia Complications Negative for: history of anesthetic complications  Airway Mallampati: II  TM Distance: >3 FB Neck ROM: Full    Dental  (+) Teeth Intact   Pulmonary neg pulmonary ROS,    breath sounds clear to auscultation       Cardiovascular negative cardio ROS   Rhythm:Regular     Neuro/Psych negative neurological ROS  negative psych ROS   GI/Hepatic Neg liver ROS,   Endo/Other  negative endocrine ROS  Renal/GU negative Renal ROS     Musculoskeletal  (+) Arthritis ,   Abdominal   Peds  Hematology negative hematology ROS (+)   Anesthesia Other Findings   Reproductive/Obstetrics                             Anesthesia Physical Anesthesia Plan  ASA: I  Anesthesia Plan: General   Post-op Pain Management:    Induction: Intravenous  PONV Risk Score and Plan: 2 and Ondansetron, Dexamethasone and Propofol infusion  Airway Management Planned: Oral ETT  Additional Equipment: None  Intra-op Plan:   Post-operative Plan: Extubation in OR  Informed Consent: I have reviewed the patients History and Physical, chart, labs and discussed the procedure including the risks, benefits and alternatives for the proposed anesthesia with the patient or authorized representative who has indicated his/her understanding and acceptance.   Dental advisory given  Plan Discussed with: CRNA and Surgeon  Anesthesia Plan Comments:         Anesthesia Quick Evaluation

## 2017-09-05 NOTE — Transfer of Care (Signed)
Immediate Anesthesia Transfer of Care Note  Patient: Aaron FunkJohn T Mumby  Procedure(s) Performed: LUMBAR THREE- LUMBAR FOUR LAMINECTOMY, LUMBAR THREE- LUMBAR FOUR POSTERIOR LATERAL ARTHRODESIS, LEFT LUMBAR FIVE HEMILAMINECTOMY AND MICRODISCECTOMY (N/A Back)  Patient Location: PACU  Anesthesia Type:General  Level of Consciousness: awake and patient cooperative  Airway & Oxygen Therapy: Patient Spontanous Breathing and Patient connected to face mask oxygen  Post-op Assessment: Report given to RN and Post -op Vital signs reviewed and stable  Post vital signs: Reviewed and stable  Last Vitals:  Vitals Value Taken Time  BP 124/85 09/05/2017 12:35 PM  Temp    Pulse 79 09/05/2017 12:37 PM  Resp 16 09/05/2017 12:37 PM  SpO2 98 % 09/05/2017 12:37 PM  Vitals shown include unvalidated device data.  Last Pain:  Vitals:   09/05/17 0555  TempSrc:   PainSc: 8       Patients Stated Pain Goal: 3 (09/05/17 0555)  Complications: No apparent anesthesia complications

## 2017-09-05 NOTE — H&P (Signed)
Subjective: Patient is a 62 y.o. 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  Patient has had difficulty with neurogenic claudication, with pain, numbness, and burning through the lower extremities with standing and activity for the past year which is felt to be due to severe stenosis at the L3-4 level.  More recently last month he developed acute left lumbar radicular pain, with severe pain to the left lower extremity.  He was 6 months ago and again last month with MRIs.  The study 6 months ago showed severe stenosis at L3-4, however the study last month showed the severe stenosis at L3-4 but a new free fragment disc herniation behind the left side of the L5 vertebral body.  He is admitted now for lumbar decompression and stabilization at the L3-4 level, specifically L3 and L4 laminectomy and medial facetectomy with bilateral L3-4 posterior lateral arthrodesis with posterior instrumentation including cortical screws and rods and bone graft, and a left L5 hemilaminectomy and microdiscectomy.   Past Medical History:  Diagnosis Date  . Arthritis     Past Surgical History:  Procedure Laterality Date  . CERVICAL SPINE SURGERY  2013    Medications Prior to Admission  Medication Sig Dispense Refill Last Dose  . acetaminophen (TYLENOL) 500 MG tablet Take 1,000 mg by mouth 3 (three) times daily as needed for moderate pain or headache.   Past Month at Unknown time  . gabapentin (NEURONTIN) 300 MG capsule Take 300 mg by mouth 2 (two) times daily.    09/04/2017 at Unknown time  . Tetrahydrozoline HCl (VISINE OP) Place 1 drop into both eyes daily as needed (dry eyes).   Past Month at Unknown time   No Known Allergies  Social  History   Tobacco Use  . Smoking status: Never Smoker  . Smokeless tobacco: Never Used  Substance Use Topics  . Alcohol use: Yes    Alcohol/week: 3.6 oz    Types: 6 Cans of beer per week    Comment: 1 beer a night    History reviewed. No pertinent family history.   Review of Systems Pertinent items noted in HPI and remainder of comprehensive ROS otherwise negative.  Objective: Vital signs in last 24 hours: Temp:  [98.2 F (36.8 C)] 98.2 F (36.8 C) (07/11 0540) Pulse Rate:  [58] 58 (07/11 0540) Resp:  [18] 18 (07/11 0540) BP: (145)/(96) 145/96 (07/11 0540) SpO2:  [98 %] 98 % (07/11 0540) Weight:  [79.8 kg (176 lb)] 79.8 kg (176 lb) (07/11 0604)  EXAM: Patient is well-developed well-nourished white male in no acute distress.  Lungs are clear to auscultation , the patient has symmetrical respiratory excursion. Heart has a regular rate and rhythm normal S1 and S2 no murmur.   Abdomen is soft nontender nondistended bowel sounds are present. Extremity examination shows no clubbing cyanosis or edema. Neurologic examination shows on motor exam left iliopsoas is 4+ right iliopsoas is 5.  Quadriceps and dorsiflexor 5 bilaterally.  Left EHL is 2-3, right EHL is 5.  Plantar flexors 5 bilaterally.  Sensation is intact to pinprick through the distal lower extremities.  Reflexes are 1 bilaterally, toes are downgoing.  Gait and stance are favoring the left lower extremity.  Data Review:CBC    Component Value  Date/Time   WBC 7.4 09/03/2017 1042   RBC 4.80 09/03/2017 1042   HGB 15.9 09/03/2017 1042   HCT 46.8 09/03/2017 1042   PLT 178 09/03/2017 1042   MCV 97.5 09/03/2017 1042   MCH 33.1 09/03/2017 1042   MCHC 34.0 09/03/2017 1042   RDW 12.4 09/03/2017 1042                          BMET    Component Value Date/Time   NA 142 09/03/2017 1042   K 4.1 09/03/2017 1042   CL 108 09/03/2017 1042   CO2 23 09/03/2017 1042   GLUCOSE 98 09/03/2017 1042   BUN 17 09/03/2017 1042   CREATININE  0.97 09/03/2017 1042   CALCIUM 9.2 09/03/2017 1042   GFRNONAA >60 09/03/2017 1042   GFRAA >60 09/03/2017 1042     Assessment/Plan: Patient with lumbar stenosis with neurogenic claudication with associated instability for the past year, but with an acute left lumbar radiculopathy, who is admitted now for decompression and stabilization at L3-4 and a left L5 laminectomy and microdiscectomy.  I've discussed with the patient the nature of his condition, the nature the surgical procedure, the typical length of surgery, hospital stay, and overall recuperation, the limitations postoperatively, and risks of surgery. I discussed risks including risks of infection, bleeding, possibly need for transfusion, the risk of nerve root dysfunction with pain, weakness, numbness, or paresthesias, the risk of dural tear and CSF leakage and possible need for further surgery, the risk of failure of the arthrodesis and possibly for further surgery, the risk of recurrent disc herniation and possible need for further surgery, the risk of anesthetic complications including myocardial infarction, stroke, pneumonia, and death. We discussed the need for postoperative immobilization in a lumbar brace. Understanding all this the patient does wish to proceed with surgery and is admitted for such.    Hewitt Shorts, MD 09/05/2017 6:55 AM

## 2017-09-06 MED ORDER — HYDROCODONE-ACETAMINOPHEN 5-325 MG PO TABS: 1.0000 | ORAL_TABLET | ORAL | 0 refills | Status: AC | PRN

## 2017-09-06 NOTE — Anesthesia Postprocedure Evaluation (Signed)
Anesthesia Post Note  Patient: Aaron DenmarkJohn T Parks  Procedure(s) Performed: LUMBAR THREE- LUMBAR FOUR LAMINECTOMY, LUMBAR THREE- LUMBAR FOUR POSTERIOR LATERAL ARTHRODESIS, LEFT LUMBAR FIVE HEMILAMINECTOMY AND MICRODISCECTOMY (N/A Back)     Patient location during evaluation: PACU Anesthesia Type: General Level of consciousness: awake and alert Pain management: pain level controlled Vital Signs Assessment: post-procedure vital signs reviewed and stable Respiratory status: spontaneous breathing, nonlabored ventilation, respiratory function stable and patient connected to nasal cannula oxygen Cardiovascular status: blood pressure returned to baseline and stable Postop Assessment: no apparent nausea or vomiting Anesthetic complications: no    Last Vitals:  Vitals:   09/06/17 0403 09/06/17 0742  BP: (!) 142/78 (!) 128/91  Pulse: 83 77  Resp: 15 17  Temp: 37 C 36.7 C  SpO2: 96% 99%    Last Pain:  Vitals:   09/06/17 1134  TempSrc:   PainSc: 2                  Nazim Kadlec

## 2017-09-06 NOTE — Discharge Instructions (Signed)
Diet °Resume your normal diet.  °Return to Work °Will be discussed at you follow up appointment. °Call Your Doctor If Any of These Occur °Redness, drainage, or swelling at the wound.  °Temperature greater than 101 degrees. °Severe pain not relieved by pain medication. °Incision starts to come apart. °Follow Up Appt °Call today for appointment in 3 weeks ([phone]) or for problems.  If you have any hardware placed in your spine, you will need an x-ray before your appointment. °

## 2017-09-06 NOTE — Progress Notes (Signed)
Patient alert and oriented, mae's well, voiding adequate amount of urine, swallowing without difficulty, no c/o pain at time of discharge. Patient discharged home with family. Script and discharged instructions given to patient. Patient and family stated understanding of instructions given. Patient has an appointment with Dr. Nudelman 

## 2017-09-06 NOTE — Discharge Summary (Signed)
Physician Discharge Summary  Patient ID: JEANETTE MOFFATT MRN: [id number] DOB/AGE: 03-Sep-1955 62 y.o.  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  Postoperatively he has done well.  He has had excellent relief of both his neurogenic claudication and his left lumbar radiculopathy.  He is up and ambulating actively.  He is voiding well.  His dressing was removed, and his incision is healing nicely.  He is being discharged home with instructions regarding wound care and activities.  He is scheduled for follow-up with me in the office in 3 weeks.  Discharge Exam: Blood pressure (!) 128/91, pulse 77, temperature 98.1 F (36.7 C), temperature source Oral, resp. rate 17, height 5\' 8"  (1.727 m), weight 79.8 kg (176 lb), SpO2 99 %.  Disposition: Discharge disposition: 01-Home or Self Care       Discharge Instructions    Discharge wound care:   Complete by:  As directed    Leave the wound open to air. Shower daily with the wound uncovered. Water and soapy water should run over the incision area. Do not wash directly on the incision for 2 weeks. Remove the glue after 2 weeks.   Driving Restrictions   Complete by:  As directed    No  driving for 2 weeks. May ride in the car locally now. May begin to drive locally in 2 weeks.   Other Restrictions   Complete by:  As directed    Walk gradually increasing distances out in the fresh air at least twice a day. Walking additional 6 times inside the house, gradually increasing distances, daily. No bending, lifting, or twisting. Perform activities between shoulder and waist height (that is at counter height when standing or table height when sitting).     Allergies as of 09/06/2017   No Known Allergies     Medication List    TAKE these medications   acetaminophen 500 MG tablet Commonly known as:  TYLENOL Take 1,000 mg by mouth 3 (three) times daily as needed for moderate pain or headache.   gabapentin 300 MG capsule Commonly known as:  NEURONTIN Take 300 mg by mouth 2 (two) times daily.   HYDROcodone-acetaminophen 5-325 MG tablet Commonly known as:  NORCO/VICODIN Take 1-2 tablets by mouth every 4 (four) hours as needed (pain).   VISINE OP Place 1 drop into both eyes daily as needed (dry eyes).            Discharge Care Instructions  (From admission, onward)        Start     Ordered   09/06/17 0000  Discharge wound care:    Comments:  Leave the wound open to air. Shower daily with the wound uncovered. Water and soapy water should run over the incision area. Do not wash directly on the incision for 2 weeks. Remove the glue after 2 weeks.   09/06/17 3086  SignedHewitt Shorts: NUDELMAN,ROBERT W 09/06/2017, 8:16 AM

## 2017-10-21 ENCOUNTER — Other Ambulatory Visit: Payer: Self-pay | Admitting: Orthopedic Surgery

## 2017-10-21 DIAGNOSIS — M25552 Pain in left hip: Secondary | ICD-10-CM

## 2017-10-21 DIAGNOSIS — M8700 Idiopathic aseptic necrosis of unspecified bone: Secondary | ICD-10-CM

## 2017-10-27 ENCOUNTER — Ambulatory Visit
Admission: RE | Admit: 2017-10-27 | Discharge: 2017-10-27 | Disposition: A | Discharge status: Home / Self Care | Payer: 59 | Source: Ambulatory Visit | Attending: Orthopedic Surgery | Admitting: Orthopedic Surgery

## 2017-10-27 DIAGNOSIS — M25552 Pain in left hip: Secondary | ICD-10-CM

## 2017-10-27 DIAGNOSIS — M8700 Idiopathic aseptic necrosis of unspecified bone: Secondary | ICD-10-CM

## 2017-11-28 ENCOUNTER — Other Ambulatory Visit: Payer: Self-pay | Admitting: Neurosurgery

## 2017-11-28 DIAGNOSIS — M5416 Radiculopathy, lumbar region: Secondary | ICD-10-CM

## 2017-12-01 ENCOUNTER — Ambulatory Visit
Admission: RE | Admit: 2017-12-01 | Discharge: 2017-12-01 | Disposition: A | Discharge status: Home / Self Care | Payer: 59 | Source: Ambulatory Visit | Attending: Neurosurgery | Admitting: Neurosurgery

## 2017-12-01 DIAGNOSIS — M5416 Radiculopathy, lumbar region: Secondary | ICD-10-CM

## 2017-12-01 MED ORDER — GADOBENATE DIMEGLUMINE 529 MG/ML IV SOLN
15.0000 mL | Freq: Once | INTRAVENOUS | Status: AC | PRN
  Administered 2017-12-01: 15 mL via INTRAVENOUS

## 2017-12-05 ENCOUNTER — Other Ambulatory Visit: Payer: Self-pay | Admitting: Neurosurgery

## 2017-12-11 ENCOUNTER — Other Ambulatory Visit: Payer: Self-pay | Admitting: Neurosurgery

## 2017-12-16 NOTE — Pre-Procedure Instructions (Signed)
Deaaron Fulghum Feenstra  12/16/2017      [id number] - [place], [place] - [address] [place] [address] Phone: [phone] Fax: [phone]    Your procedure is scheduled on October 28th.  Report to [facility] Admitting at 1000 A.M.  Call this number if you have problems the morning of surgery:  [phone]   Remember:  Do not eat or drink after midnight.    Take these medicines the morning of surgery with A SIP OF WATER   Tylenol (if needed)  Eye Drops (if needed)  7 days prior to surgery STOP taking any diclofenac (VOLTAREN) Aspirin(unless otherwise instructed by your surgeon), Aleve, Naproxen, Ibuprofen, Motrin, Advil, Goody's, BC's, all herbal medications, fish oil, and all vitamins     Do not wear jewelry, make-up or nail polish.  Do not wear lotions, powders, or perfumes, or deodorant.  Do not shave 48 hours prior to surgery.  Men may shave face and neck.  Do not bring valuables to the hospital.  [facility] is not responsible for any belongings or valuables.  Contacts, dentures or bridgework may not be worn into surgery.  Leave your suitcase in the car.  After surgery it may be brought to your room.  For patients admitted to the hospital, discharge time will be determined by your treatment team.  Patients discharged the day of surgery will not be allowed to drive home.    [facility]- Preparing For Surgery  Before surgery, you can play an important role. Because skin is not sterile, your skin needs to be as free of germs as possible. You can reduce the number of germs on your skin by washing with CHG (chlorahexidine gluconate) Soap before surgery.  CHG is an antiseptic cleaner which kills germs and bonds with the skin to continue killing germs even after washing.    Oral Hygiene is also important to reduce your risk of infection.  Remember - BRUSH YOUR TEETH THE MORNING OF SURGERY WITH YOUR REGULAR  TOOTHPASTE  Please do not use if you have an allergy to CHG or antibacterial soaps. If your skin becomes reddened/irritated stop using the CHG.  Do not shave (including legs and underarms) for at least 48 hours prior to first CHG shower. It is OK to shave your face.  Please follow these instructions carefully.   1. Shower the NIGHT BEFORE SURGERY and the MORNING OF SURGERY with CHG.   2. If you chose to wash your hair, wash your hair first as usual with your normal shampoo.  3. After you shampoo, rinse your hair and body thoroughly to remove the shampoo.  4. Use CHG as you would any other liquid soap. You can apply CHG directly to the skin and wash gently with a scrungie or a clean washcloth.   5. Apply the CHG Soap to your body ONLY FROM THE NECK DOWN.  Do not use on open wounds or open sores. Avoid contact with your eyes, ears, mouth and genitals (private parts). Wash Face and genitals (private parts)  with your normal soap.  6. Wash thoroughly, paying special attention to the area where your surgery will be performed.  7. Thoroughly rinse your body with warm water from the neck down.  8. DO NOT shower/wash with your normal soap after using and rinsing off the CHG Soap.  9. Pat yourself dry with a CLEAN TOWEL.  10. Wear CLEAN PAJAMAS to bed the night before surgery,  wear comfortable clothes the morning of surgery  11. Place CLEAN SHEETS on your bed the night of your first shower and DO NOT SLEEP WITH PETS.    Day of Surgery:  Do not apply any deodorants/lotions.  Please wear clean clothes to the hospital/surgery center.   Remember to brush your teeth WITH YOUR REGULAR TOOTHPASTE.    Please read over the following fact sheets that you were given.

## 2017-12-17 ENCOUNTER — Encounter (HOSPITAL_COMMUNITY): Payer: Self-pay

## 2017-12-17 ENCOUNTER — Other Ambulatory Visit: Payer: Self-pay

## 2017-12-17 ENCOUNTER — Encounter (HOSPITAL_COMMUNITY)
Admission: RE | Admit: 2017-12-17 | Discharge: 2017-12-17 | Disposition: A | Discharge status: Home / Self Care | Payer: 59 | Source: Ambulatory Visit | Attending: Neurosurgery | Admitting: Neurosurgery

## 2017-12-17 DIAGNOSIS — M5416 Radiculopathy, lumbar region: Secondary | ICD-10-CM | POA: Diagnosis not present

## 2017-12-17 DIAGNOSIS — Z01818 Encounter for other preprocedural examination: Secondary | ICD-10-CM | POA: Insufficient documentation

## 2017-12-17 HISTORY — DX: Headache: R51

## 2017-12-17 HISTORY — DX: Headache, unspecified: R51.9

## 2017-12-17 LAB — CBC
HEMATOCRIT: 47.4 % (ref 39.0–52.0)
Hemoglobin: 16.2 g/dL (ref 13.0–17.0)
MCH: 33.1 pg (ref 26.0–34.0)
MCHC: 34.2 g/dL (ref 30.0–36.0)
MCV: 96.7 fL (ref 80.0–100.0)
NRBC: 0 % (ref 0.0–0.2)
Platelets: 191 10*3/uL (ref 150–400)
RBC: 4.9 MIL/uL (ref 4.22–5.81)
RDW: 12.5 % (ref 11.5–15.5)
WBC: 7 10*3/uL (ref 4.0–10.5)

## 2017-12-17 LAB — BASIC METABOLIC PANEL
Anion gap: 7 (ref 5–15)
BUN: 16 mg/dL (ref 8–23)
CHLORIDE: 107 mmol/L (ref 98–111)
CO2: 24 mmol/L (ref 22–32)
CREATININE: 1.08 mg/dL (ref 0.61–1.24)
Calcium: 9.5 mg/dL (ref 8.9–10.3)
GFR calc Af Amer: >60 mL/min (ref >60)
GFR calc non Af Amer: >60 mL/min (ref >60)
GLUCOSE: 103 mg/dL — AB (ref 70–99)
Potassium: 4.2 mmol/L (ref 3.5–5.1)
Sodium: 138 mmol/L (ref 135–145)

## 2017-12-17 LAB — SURGICAL PCR SCREEN
MRSA, PCR: NEGATIVE
Staphylococcus aureus: NEGATIVE

## 2017-12-17 LAB — TYPE AND SCREEN
ABO/RH(D): B POS
Antibody Screen: NEGATIVE

## 2017-12-17 NOTE — Progress Notes (Signed)
PCP - No PCP per patient  Cardiologist - denies  Chest x-ray - N/A EKG - N/A Stress Test - denies ECHO - denies Cardiac Cath - denies  Sleep Study - denies  Anesthesia review: no Patient denies shortness of breath, fever, cough and chest pain at PAT appointment   Patient verbalized understanding of instructions that were given to them at the PAT appointment. Patient was also instructed that they will need to review over the PAT instructions again at home before surgery.

## 2017-12-23 ENCOUNTER — Encounter (HOSPITAL_COMMUNITY): Admission: RE | Payer: Self-pay | Source: Ambulatory Visit

## 2017-12-23 ENCOUNTER — Inpatient Hospital Stay (HOSPITAL_COMMUNITY): Admission: RE | Admit: 2017-12-23 | Payer: 59 | Source: Ambulatory Visit | Admitting: Neurosurgery

## 2017-12-23 ENCOUNTER — Other Ambulatory Visit: Payer: Self-pay | Admitting: Neurosurgery

## 2017-12-23 SURGERY — POSTERIOR LUMBAR FUSION 1 LEVEL
Anesthesia: General | Site: Back

## 2018-01-15 ENCOUNTER — Encounter (HOSPITAL_COMMUNITY): Payer: Self-pay | Admitting: *Deleted

## 2018-01-15 ENCOUNTER — Other Ambulatory Visit: Payer: Self-pay

## 2018-01-15 NOTE — Progress Notes (Signed)
Pt stated that nothing has changed since PAT appointment on 12/17/17. Pt denies SOB, chest pain, and being under the care of a cardiologist. Pt denies having a stress test, echo and cardiac cath. Pt denies having an EKG and chest x ray. Pt stated that he has stopped taking  Aspirin,vitamins, fish oil and herbal medications. Did not take any NSAIDs ie: Ibuprofen, Advil, Naproxen (Aleve), Motrin, BC and Goody Powder as indicated with previous pre-op instructions. Pt verbalized understanding of all pre-op instructions.

## 2018-01-15 NOTE — Anesthesia Preprocedure Evaluation (Addendum)
Anesthesia Evaluation  Patient identified by MRN, date of birth, ID band Patient awake    Reviewed: Allergy & Precautions, H&P , NPO status , Patient's Chart, lab work & pertinent test results  Airway Mallampati: II  TM Distance: >3 FB Neck ROM: Full    Dental  (+) Teeth Intact, Dental Advisory Given   Pulmonary neg pulmonary ROS,    breath sounds clear to auscultation       Cardiovascular Exercise Tolerance: Good negative cardio ROS   Rhythm:Regular Rate:Normal     Neuro/Psych  Headaches, negative psych ROS   GI/Hepatic negative GI ROS, (+)       marijuana use,   Endo/Other  negative endocrine ROS  Renal/GU negative Renal ROS     Musculoskeletal  (+) Arthritis ,   Abdominal   Peds  Hematology negative hematology ROS (+)   Anesthesia Other Findings   Reproductive/Obstetrics                          Lab Results  Component Value Date   WBC 7.0 12/17/2017   HGB 16.2 12/17/2017   HCT 47.4 12/17/2017   MCV 96.7 12/17/2017   PLT 191 12/17/2017    Anesthesia Physical Anesthesia Plan  ASA: I  Anesthesia Plan: General   Post-op Pain Management:    Induction: Intravenous  PONV Risk Score and Plan: 2 and Treatment may vary due to age or medical condition, Ondansetron, Dexamethasone, Propofol infusion and Scopolamine patch - Pre-op  Airway Management Planned: Oral ETT  Additional Equipment:   Intra-op Plan:   Post-operative Plan: Extubation in OR  Informed Consent: I have reviewed the patients History and Physical, chart, labs and discussed the procedure including the risks, benefits and alternatives for the proposed anesthesia with the patient or authorized representative who has indicated his/her understanding and acceptance.   Dental advisory given  Plan Discussed with: CRNA  Anesthesia Plan Comments:       Anesthesia Quick Evaluation

## 2018-01-16 ENCOUNTER — Inpatient Hospital Stay (HOSPITAL_COMMUNITY): Payer: 59

## 2018-01-16 ENCOUNTER — Inpatient Hospital Stay (HOSPITAL_COMMUNITY): Payer: 59 | Admitting: Physician Assistant

## 2018-01-16 ENCOUNTER — Encounter (HOSPITAL_COMMUNITY): Payer: Self-pay | Admitting: *Deleted

## 2018-01-16 ENCOUNTER — Inpatient Hospital Stay (HOSPITAL_COMMUNITY)
Admission: RE | Disposition: A | Discharge status: Home / Self Care | Payer: Self-pay | Source: Home / Self Care | Attending: Neurosurgery

## 2018-01-16 ENCOUNTER — Inpatient Hospital Stay (HOSPITAL_COMMUNITY)
Admission: RE | Admit: 2018-01-16 | Discharge: 2018-01-17 | DRG: 455 | Disposition: A | Discharge status: Home / Self Care | Payer: 59 | Attending: Neurosurgery | Admitting: Neurosurgery

## 2018-01-16 DIAGNOSIS — Z419 Encounter for procedure for purposes other than remedying health state, unspecified: Secondary | ICD-10-CM

## 2018-01-16 DIAGNOSIS — Z79899 Other long term (current) drug therapy: Secondary | ICD-10-CM | POA: Diagnosis not present

## 2018-01-16 DIAGNOSIS — M48062 Spinal stenosis, lumbar region with neurogenic claudication: Secondary | ICD-10-CM | POA: Diagnosis present

## 2018-01-16 DIAGNOSIS — M5126 Other intervertebral disc displacement, lumbar region: Secondary | ICD-10-CM | POA: Diagnosis present

## 2018-01-16 DIAGNOSIS — M47816 Spondylosis without myelopathy or radiculopathy, lumbar region: Secondary | ICD-10-CM | POA: Diagnosis present

## 2018-01-16 DIAGNOSIS — Z981 Arthrodesis status: Secondary | ICD-10-CM | POA: Diagnosis not present

## 2018-01-16 DIAGNOSIS — M5136 Other intervertebral disc degeneration, lumbar region: Secondary | ICD-10-CM | POA: Diagnosis present

## 2018-01-16 HISTORY — DX: Spinal stenosis, lumbar region with neurogenic claudication: M48.062

## 2018-01-16 LAB — CBC
HEMATOCRIT: 44.4 % (ref 39.0–52.0)
HEMOGLOBIN: 14.6 g/dL (ref 13.0–17.0)
MCH: 32.9 pg (ref 26.0–34.0)
MCHC: 32.9 g/dL (ref 30.0–36.0)
MCV: 100 fL (ref 80.0–100.0)
NRBC: 0 % (ref 0.0–0.2)
Platelets: 193 10*3/uL (ref 150–400)
RBC: 4.44 MIL/uL (ref 4.22–5.81)
RDW: 12.5 % (ref 11.5–15.5)
WBC: 6.9 10*3/uL (ref 4.0–10.5)

## 2018-01-16 LAB — TYPE AND SCREEN
ABO/RH(D): B POS
Antibody Screen: NEGATIVE

## 2018-01-16 SURGERY — POSTERIOR LUMBAR FUSION 1 LEVEL
Anesthesia: General | Site: Back

## 2018-01-16 MED ORDER — SCOPOLAMINE 1 MG/3DAYS TD PT72
MEDICATED_PATCH | TRANSDERMAL | Status: DC | PRN
  Administered 2018-01-16: 1 via TRANSDERMAL

## 2018-01-16 MED ORDER — PROPOFOL 10 MG/ML IV BOLUS
INTRAVENOUS | Status: DC | PRN
  Administered 2018-01-16: 20 mg via INTRAVENOUS
  Administered 2018-01-16: 180 mg via INTRAVENOUS

## 2018-01-16 MED ORDER — ACETAMINOPHEN 10 MG/ML IV SOLN
1000.0000 mg | Freq: Once | INTRAVENOUS | Status: AC
  Administered 2018-01-16: 1000 mg via INTRAVENOUS

## 2018-01-16 MED ORDER — FENTANYL CITRATE (PF) 250 MCG/5ML IJ SOLN
INTRAMUSCULAR | Status: AC
  Filled 2018-01-16: qty 5

## 2018-01-16 MED ORDER — PHENYLEPHRINE 40 MCG/ML (10ML) SYRINGE FOR IV PUSH (FOR BLOOD PRESSURE SUPPORT)
PREFILLED_SYRINGE | INTRAVENOUS | Status: AC
  Filled 2018-01-16: qty 10

## 2018-01-16 MED ORDER — ONDANSETRON HCL 4 MG/2ML IJ SOLN
INTRAMUSCULAR | Status: AC
  Filled 2018-01-16: qty 2

## 2018-01-16 MED ORDER — THROMBIN 5000 UNITS EX SOLR
CUTANEOUS | Status: AC
  Filled 2018-01-16: qty 5000

## 2018-01-16 MED ORDER — MORPHINE SULFATE (PF) 4 MG/ML IV SOLN: 4.0000 mg | INTRAVENOUS | Status: DC | PRN

## 2018-01-16 MED ORDER — LACTATED RINGERS IV SOLN
INTRAVENOUS | Status: DC | PRN
  Administered 2018-01-16 (×3): via INTRAVENOUS

## 2018-01-16 MED ORDER — SUCCINYLCHOLINE CHLORIDE 200 MG/10ML IV SOSY
PREFILLED_SYRINGE | INTRAVENOUS | Status: AC
  Filled 2018-01-16: qty 10

## 2018-01-16 MED ORDER — EPHEDRINE SULFATE-NACL 50-0.9 MG/10ML-% IV SOSY
PREFILLED_SYRINGE | INTRAVENOUS | Status: DC | PRN
  Administered 2018-01-16: 5 mg via INTRAVENOUS

## 2018-01-16 MED ORDER — PROPOFOL 500 MG/50ML IV EMUL
INTRAVENOUS | Status: DC | PRN
  Administered 2018-01-16: 50 ug/kg/min via INTRAVENOUS
  Administered 2018-01-16: 11:00:00 via INTRAVENOUS

## 2018-01-16 MED ORDER — KETOROLAC TROMETHAMINE 30 MG/ML IJ SOLN
30.0000 mg | Freq: Once | INTRAMUSCULAR | Status: AC
  Administered 2018-01-16: 30 mg via INTRAVENOUS

## 2018-01-16 MED ORDER — TAMSULOSIN HCL 0.4 MG PO CAPS
0.4000 mg | ORAL_CAPSULE | Freq: Every day | ORAL | Status: DC
  Administered 2018-01-17: 0.4 mg via ORAL
  Filled 2018-01-16: qty 1

## 2018-01-16 MED ORDER — ACETAMINOPHEN 325 MG PO TABS: 650.0000 mg | ORAL_TABLET | ORAL | Status: DC | PRN

## 2018-01-16 MED ORDER — SCOPOLAMINE 1 MG/3DAYS TD PT72
MEDICATED_PATCH | TRANSDERMAL | Status: AC
  Filled 2018-01-16: qty 1

## 2018-01-16 MED ORDER — BUPIVACAINE HCL (PF) 0.5 % IJ SOLN
INTRAMUSCULAR | Status: DC | PRN
  Administered 2018-01-16: 15 mL

## 2018-01-16 MED ORDER — FLEET ENEMA 7-19 GM/118ML RE ENEM: 1.0000 | ENEMA | Freq: Once | RECTAL | Status: DC | PRN

## 2018-01-16 MED ORDER — SODIUM CHLORIDE 0.9 % IV SOLN
INTRAVENOUS | Status: DC | PRN
  Administered 2018-01-16 (×2): 500 mL

## 2018-01-16 MED ORDER — 0.9 % SODIUM CHLORIDE (POUR BTL) OPTIME
TOPICAL | Status: DC | PRN
  Administered 2018-01-16 (×2): 1000 mL

## 2018-01-16 MED ORDER — CEFAZOLIN SODIUM 1 G IJ SOLR
INTRAMUSCULAR | Status: AC
  Filled 2018-01-16: qty 20

## 2018-01-16 MED ORDER — PROPOFOL 500 MG/50ML IV EMUL
INTRAVENOUS | Status: AC
  Filled 2018-01-16: qty 50

## 2018-01-16 MED ORDER — PHENYLEPHRINE 40 MCG/ML (10ML) SYRINGE FOR IV PUSH (FOR BLOOD PRESSURE SUPPORT)
PREFILLED_SYRINGE | INTRAVENOUS | Status: DC | PRN
  Administered 2018-01-16 (×2): 40 ug via INTRAVENOUS
  Administered 2018-01-16: 80 ug via INTRAVENOUS
  Administered 2018-01-16: 120 ug via INTRAVENOUS

## 2018-01-16 MED ORDER — SODIUM CHLORIDE 0.9 % IV SOLN: 250.0000 mL | INTRAVENOUS | Status: DC

## 2018-01-16 MED ORDER — CHLORHEXIDINE GLUCONATE CLOTH 2 % EX PADS: 6.0000 | MEDICATED_PAD | Freq: Once | CUTANEOUS | Status: DC

## 2018-01-16 MED ORDER — PROMETHAZINE HCL 25 MG/ML IJ SOLN: 6.2500 mg | INTRAMUSCULAR | Status: DC | PRN

## 2018-01-16 MED ORDER — SODIUM CHLORIDE 0.9% FLUSH
3.0000 mL | Freq: Two times a day (BID) | INTRAVENOUS | Status: DC
  Administered 2018-01-16 – 2018-01-17 (×2): 3 mL via INTRAVENOUS

## 2018-01-16 MED ORDER — DEXAMETHASONE SODIUM PHOSPHATE 10 MG/ML IJ SOLN
INTRAMUSCULAR | Status: AC
  Filled 2018-01-16: qty 1

## 2018-01-16 MED ORDER — ACETAMINOPHEN 10 MG/ML IV SOLN: 1000.0000 mg | Freq: Once | INTRAVENOUS | Status: DC | PRN

## 2018-01-16 MED ORDER — THROMBIN 5000 UNITS EX SOLR
OROMUCOSAL | Status: DC | PRN
  Administered 2018-01-16: 5 mL via TOPICAL

## 2018-01-16 MED ORDER — ONDANSETRON HCL 4 MG/2ML IJ SOLN
INTRAMUSCULAR | Status: DC | PRN
  Administered 2018-01-16: 4 mg via INTRAVENOUS

## 2018-01-16 MED ORDER — MENTHOL 3 MG MT LOZG: 1.0000 | LOZENGE | OROMUCOSAL | Status: DC | PRN

## 2018-01-16 MED ORDER — FENTANYL CITRATE (PF) 100 MCG/2ML IJ SOLN
INTRAMUSCULAR | Status: DC | PRN
  Administered 2018-01-16 (×7): 50 ug via INTRAVENOUS
  Administered 2018-01-16: 100 ug via INTRAVENOUS
  Administered 2018-01-16: 50 ug via INTRAVENOUS

## 2018-01-16 MED ORDER — MEPERIDINE HCL 50 MG/ML IJ SOLN: 6.2500 mg | INTRAMUSCULAR | Status: DC | PRN

## 2018-01-16 MED ORDER — HYDROCODONE-ACETAMINOPHEN 7.5-325 MG PO TABS: 1.0000 | ORAL_TABLET | Freq: Once | ORAL | Status: DC | PRN

## 2018-01-16 MED ORDER — LIDOCAINE-EPINEPHRINE 1 %-1:100000 IJ SOLN
INTRAMUSCULAR | Status: DC | PRN
  Administered 2018-01-16: 15 mL

## 2018-01-16 MED ORDER — TAMSULOSIN HCL 0.4 MG PO CAPS
0.8000 mg | ORAL_CAPSULE | Freq: Once | ORAL | Status: AC
  Administered 2018-01-16: 0.8 mg via ORAL
  Filled 2018-01-16: qty 2

## 2018-01-16 MED ORDER — ACETAMINOPHEN 650 MG RE SUPP: 650.0000 mg | RECTAL | Status: DC | PRN

## 2018-01-16 MED ORDER — BISACODYL 10 MG RE SUPP: 10.0000 mg | Freq: Every day | RECTAL | Status: DC | PRN

## 2018-01-16 MED ORDER — THROMBIN (RECOMBINANT) 20000 UNITS EX SOLR
CUTANEOUS | Status: AC
  Filled 2018-01-16: qty 20000

## 2018-01-16 MED ORDER — LIDOCAINE-EPINEPHRINE 1 %-1:100000 IJ SOLN
INTRAMUSCULAR | Status: AC
  Filled 2018-01-16: qty 1

## 2018-01-16 MED ORDER — BUPIVACAINE HCL (PF) 0.5 % IJ SOLN
INTRAMUSCULAR | Status: AC
  Filled 2018-01-16: qty 30

## 2018-01-16 MED ORDER — THROMBIN 20000 UNITS EX SOLR
CUTANEOUS | Status: DC | PRN
  Administered 2018-01-16: 20 mL via TOPICAL

## 2018-01-16 MED ORDER — CEFAZOLIN SODIUM-DEXTROSE 2-4 GM/100ML-% IV SOLN
2.0000 g | INTRAVENOUS | Status: AC
  Administered 2018-01-16 (×2): 2 g via INTRAVENOUS

## 2018-01-16 MED ORDER — HYDROXYZINE HCL 25 MG PO TABS: 50.0000 mg | ORAL_TABLET | ORAL | Status: DC | PRN

## 2018-01-16 MED ORDER — EPHEDRINE 5 MG/ML INJ
INTRAVENOUS | Status: AC
  Filled 2018-01-16: qty 10

## 2018-01-16 MED ORDER — KETOROLAC TROMETHAMINE 30 MG/ML IJ SOLN
30.0000 mg | Freq: Four times a day (QID) | INTRAMUSCULAR | Status: DC
  Administered 2018-01-16 – 2018-01-17 (×3): 30 mg via INTRAVENOUS
  Filled 2018-01-16 (×3): qty 1

## 2018-01-16 MED ORDER — MAGNESIUM HYDROXIDE 400 MG/5ML PO SUSP: 30.0000 mL | Freq: Every day | ORAL | Status: DC | PRN

## 2018-01-16 MED ORDER — ACETAMINOPHEN 10 MG/ML IV SOLN
INTRAVENOUS | Status: AC
  Filled 2018-01-16: qty 100

## 2018-01-16 MED ORDER — ALUM & MAG HYDROXIDE-SIMETH 200-200-20 MG/5ML PO SUSP: 30.0000 mL | Freq: Four times a day (QID) | ORAL | Status: DC | PRN

## 2018-01-16 MED ORDER — PHENOL 1.4 % MT LIQD: 1.0000 | OROMUCOSAL | Status: DC | PRN

## 2018-01-16 MED ORDER — LIDOCAINE 2% (20 MG/ML) 5 ML SYRINGE
INTRAMUSCULAR | Status: AC
  Filled 2018-01-16: qty 5

## 2018-01-16 MED ORDER — PROPOFOL 10 MG/ML IV BOLUS
INTRAVENOUS | Status: AC
  Filled 2018-01-16: qty 20

## 2018-01-16 MED ORDER — HYDROXYZINE HCL 50 MG/ML IM SOLN: 50.0000 mg | INTRAMUSCULAR | Status: DC | PRN

## 2018-01-16 MED ORDER — CEFAZOLIN SODIUM-DEXTROSE 2-4 GM/100ML-% IV SOLN
INTRAVENOUS | Status: AC
  Filled 2018-01-16: qty 100

## 2018-01-16 MED ORDER — CYCLOBENZAPRINE HCL 5 MG PO TABS: 5.0000 mg | ORAL_TABLET | Freq: Three times a day (TID) | ORAL | Status: DC | PRN

## 2018-01-16 MED ORDER — HYDROCODONE-ACETAMINOPHEN 5-325 MG PO TABS
1.0000 | ORAL_TABLET | ORAL | Status: DC | PRN
  Administered 2018-01-16: 2 via ORAL
  Administered 2018-01-17: 1 via ORAL
  Filled 2018-01-16: qty 1
  Filled 2018-01-16: qty 2

## 2018-01-16 MED ORDER — SODIUM CHLORIDE 0.9 % IV SOLN
INTRAVENOUS | Status: DC | PRN
  Administered 2018-01-16: 25 ug/min via INTRAVENOUS

## 2018-01-16 MED ORDER — DEXAMETHASONE SODIUM PHOSPHATE 10 MG/ML IJ SOLN
INTRAMUSCULAR | Status: DC | PRN
  Administered 2018-01-16: 10 mg via INTRAVENOUS

## 2018-01-16 MED ORDER — MIDAZOLAM HCL 2 MG/2ML IJ SOLN
INTRAMUSCULAR | Status: AC
  Filled 2018-01-16: qty 2

## 2018-01-16 MED ORDER — GABAPENTIN 300 MG PO CAPS
300.0000 mg | ORAL_CAPSULE | Freq: Every evening | ORAL | Status: DC
  Administered 2018-01-16: 300 mg via ORAL
  Filled 2018-01-16 (×2): qty 1

## 2018-01-16 MED ORDER — LIDOCAINE 2% (20 MG/ML) 5 ML SYRINGE
INTRAMUSCULAR | Status: DC | PRN
  Administered 2018-01-16: 100 mg via INTRAVENOUS

## 2018-01-16 MED ORDER — SUCCINYLCHOLINE CHLORIDE 20 MG/ML IJ SOLN
INTRAMUSCULAR | Status: DC | PRN
  Administered 2018-01-16: 90 mg via INTRAVENOUS

## 2018-01-16 MED ORDER — KETOROLAC TROMETHAMINE 30 MG/ML IJ SOLN
INTRAMUSCULAR | Status: AC
  Administered 2018-01-16: 30 mg
  Filled 2018-01-16: qty 1

## 2018-01-16 MED ORDER — SODIUM CHLORIDE 0.9% FLUSH: 3.0000 mL | INTRAVENOUS | Status: DC | PRN

## 2018-01-16 MED ORDER — MIDAZOLAM HCL 5 MG/5ML IJ SOLN
INTRAMUSCULAR | Status: DC | PRN
  Administered 2018-01-16: 2 mg via INTRAVENOUS

## 2018-01-16 MED ORDER — KCL IN DEXTROSE-NACL 20-5-0.45 MEQ/L-%-% IV SOLN: INTRAVENOUS | Status: DC

## 2018-01-16 MED ORDER — HYDROMORPHONE HCL 1 MG/ML IJ SOLN: 0.2500 mg | INTRAMUSCULAR | Status: DC | PRN

## 2018-01-16 MED ORDER — ALBUMIN HUMAN 5 % IV SOLN
INTRAVENOUS | Status: DC | PRN
  Administered 2018-01-16: 11:00:00 via INTRAVENOUS

## 2018-01-16 SURGICAL SUPPLY — 76 items
BAG DECANTER FOR FLEXI CONT (MISCELLANEOUS) ×4 IMPLANT
BENZOIN TINCTURE PRP APPL 2/3 (GAUZE/BANDAGES/DRESSINGS) ×2 IMPLANT
BLADE CLIPPER SURG (BLADE) IMPLANT
BUR ACRON 5.0MM COATED (BURR) ×2 IMPLANT
BUR MATCHSTICK NEURO 3.0 LAGG (BURR) ×2 IMPLANT
CAGE COROENT TI LG 11X9X28 4D (Cage) ×4 IMPLANT
CANISTER SUCT 3000ML PPV (MISCELLANEOUS) ×2 IMPLANT
CAP RELINE MOD TULIP RMM (Cap) ×6 IMPLANT
CARTRIDGE OIL MAESTRO DRILL (MISCELLANEOUS) ×1 IMPLANT
CLIP NEUROVISION LG (CLIP) ×2 IMPLANT
CONT SPEC 4OZ CLIKSEAL STRL BL (MISCELLANEOUS) ×2 IMPLANT
COVER BACK TABLE 60X90IN (DRAPES) ×2 IMPLANT
COVER WAND RF STERILE (DRAPES) ×2 IMPLANT
DECANTER SPIKE VIAL GLASS SM (MISCELLANEOUS) ×2 IMPLANT
DERMABOND ADVANCED (GAUZE/BANDAGES/DRESSINGS) ×2
DERMABOND ADVANCED .7 DNX12 (GAUZE/BANDAGES/DRESSINGS) ×2 IMPLANT
DIFFUSER DRILL AIR PNEUMATIC (MISCELLANEOUS) ×2 IMPLANT
DRAPE C-ARM 42X72 X-RAY (DRAPES) ×2 IMPLANT
DRAPE C-ARMOR (DRAPES) ×2 IMPLANT
DRAPE HALF SHEET 40X57 (DRAPES) ×2 IMPLANT
DRAPE LAPAROTOMY 100X72X124 (DRAPES) ×2 IMPLANT
DRAPE POUCH INSTRU U-SHP 10X18 (DRAPES) IMPLANT
ELECT REM PT RETURN 9FT ADLT (ELECTROSURGICAL) ×2
ELECTRODE REM PT RTRN 9FT ADLT (ELECTROSURGICAL) ×1 IMPLANT
GAUZE 4X4 16PLY RFD (DISPOSABLE) IMPLANT
GAUZE SPONGE 4X4 12PLY STRL (GAUZE/BANDAGES/DRESSINGS) IMPLANT
GAUZE SPONGE 4X4 12PLY STRL LF (GAUZE/BANDAGES/DRESSINGS) ×2 IMPLANT
GLOVE BIO SURGEON STRL SZ8 (GLOVE) ×2 IMPLANT
GLOVE BIOGEL PI IND STRL 7.0 (GLOVE) ×3 IMPLANT
GLOVE BIOGEL PI IND STRL 7.5 (GLOVE) ×3 IMPLANT
GLOVE BIOGEL PI IND STRL 8 (GLOVE) ×6 IMPLANT
GLOVE BIOGEL PI IND STRL 8.5 (GLOVE) ×1 IMPLANT
GLOVE BIOGEL PI INDICATOR 7.0 (GLOVE) ×3
GLOVE BIOGEL PI INDICATOR 7.5 (GLOVE) ×3
GLOVE BIOGEL PI INDICATOR 8 (GLOVE) ×6
GLOVE BIOGEL PI INDICATOR 8.5 (GLOVE) ×1
GLOVE ECLIPSE 7.5 STRL STRAW (GLOVE) ×14 IMPLANT
GOWN STRL REUS W/ TWL LRG LVL3 (GOWN DISPOSABLE) IMPLANT
GOWN STRL REUS W/ TWL XL LVL3 (GOWN DISPOSABLE) ×5 IMPLANT
GOWN STRL REUS W/TWL 2XL LVL3 (GOWN DISPOSABLE) ×8 IMPLANT
GOWN STRL REUS W/TWL LRG LVL3 (GOWN DISPOSABLE)
GOWN STRL REUS W/TWL XL LVL3 (GOWN DISPOSABLE) ×5
HEMOSTAT POWDER KIT SURGIFOAM (HEMOSTASIS) ×2 IMPLANT
KIT BASIN OR (CUSTOM PROCEDURE TRAY) ×2 IMPLANT
KIT INFUSE X SMALL 1.4CC (Orthopedic Implant) ×2 IMPLANT
KIT TURNOVER KIT B (KITS) ×2 IMPLANT
MODULE NVM5 NEXT GEN EMG (NEEDLE) ×2 IMPLANT
NEEDLE ASP BONE MRW 8GX15 (NEEDLE) IMPLANT
NEEDLE SPNL 18GX3.5 QUINCKE PK (NEEDLE) ×2 IMPLANT
NEEDLE SPNL 22GX3.5 QUINCKE BK (NEEDLE) ×4 IMPLANT
NS IRRIG 1000ML POUR BTL (IV SOLUTION) ×4 IMPLANT
OIL CARTRIDGE MAESTRO DRILL (MISCELLANEOUS) ×2
PACK LAMINECTOMY NEURO (CUSTOM PROCEDURE TRAY) ×2 IMPLANT
PAD ARMBOARD 7.5X6 YLW CONV (MISCELLANEOUS) ×6 IMPLANT
PATTIES SURGICAL .5 X.5 (GAUZE/BANDAGES/DRESSINGS) ×2 IMPLANT
PATTIES SURGICAL .5 X1 (DISPOSABLE) ×4 IMPLANT
PATTIES SURGICAL 1X1 (DISPOSABLE) ×2 IMPLANT
ROD LORD RELINE O 5X80 (Rod) ×4 IMPLANT
SCREW LOCK RSS 4.5/5.0MM (Screw) ×12 IMPLANT
SCREW SHANK RELINE MOD 5.0X30 (Screw) ×2 IMPLANT
SCREW SHANK RELINE MOD 5.0X35 (Screw) ×2 IMPLANT
SPONGE LAP 4X18 RFD (DISPOSABLE) IMPLANT
SPONGE NEURO XRAY DETECT 1X3 (DISPOSABLE) IMPLANT
SPONGE SURGIFOAM ABS GEL 100 (HEMOSTASIS) ×2 IMPLANT
STRIP BIOACTIVE VITOSS 25X100X (Neuro Prosthesis/Implant) ×2 IMPLANT
STRIP VITOSS 25X50X4MM (Neuro Prosthesis/Implant) ×2 IMPLANT
SUT VIC AB 1 CT1 18XBRD ANBCTR (SUTURE) ×3 IMPLANT
SUT VIC AB 1 CT1 8-18 (SUTURE) ×3
SUT VIC AB 2-0 CP2 18 (SUTURE) ×6 IMPLANT
SYR 3ML LL SCALE MARK (SYRINGE) IMPLANT
SYR CONTROL 10ML LL (SYRINGE) ×2 IMPLANT
TAPE CLOTH SURG 4X10 WHT LF (GAUZE/BANDAGES/DRESSINGS) ×2 IMPLANT
TOWEL GREEN STERILE (TOWEL DISPOSABLE) ×2 IMPLANT
TOWEL GREEN STERILE FF (TOWEL DISPOSABLE) ×2 IMPLANT
TRAY FOLEY MTR SLVR 16FR STAT (SET/KITS/TRAYS/PACK) ×2 IMPLANT
WATER STERILE IRR 1000ML POUR (IV SOLUTION) ×2 IMPLANT

## 2018-01-16 NOTE — Transfer of Care (Signed)
Immediate Anesthesia Transfer of Care Note  Patient: Kirby FunkJohn T Forton  Procedure(s) Performed: LUMBAR TWO- LUMBAR THREE DECOMPRESSION,POSTERIOR LUMBAR INTERBODY FUSION, POSTERIOR LATERAL ARTHRODESIS (N/A Back)  Patient Location: PACU  Anesthesia Type:General  Level of Consciousness: oriented, sedated and patient cooperative  Airway & Oxygen Therapy: Patient Spontanous Breathing and Patient connected to face mask oxygen  Post-op Assessment: Report given to RN and Post -op Vital signs reviewed and stable  Post vital signs: Reviewed  Last Vitals:  Vitals Value Taken Time  BP 119/76 01/16/2018  1:04 PM  Temp    Pulse 85 01/16/2018  1:04 PM  Resp 14 01/16/2018  1:04 PM  SpO2 100 % 01/16/2018  1:04 PM  Vitals shown include unvalidated device data.  Last Pain:  Vitals:   01/16/18 0637  TempSrc:   PainSc: 3          Complications: No apparent anesthesia complications

## 2018-01-16 NOTE — Progress Notes (Signed)
Vitals:   01/16/18 1414 01/16/18 1415 01/16/18 1436 01/16/18 1701  BP:  127/84 123/87 125/80  Pulse: 89 85 82 61  Resp: 13 11 16 16   Temp:  98.4 F (36.9 C) 97.8 F (36.6 C) 97.6 F (36.4 C)  TempSrc:   Oral Oral  SpO2: 99% 96% 98% 97%  Weight:      Height:        CBC Recent Labs    01/16/18 0635  WBC 6.9  HGB 14.6  HCT 44.4  PLT 193                                                           Patient resting in bed, has ambulated down the hall.  Dressing clean and dry.  Foley DC'd, no void yet, nursing staff monitoring voiding function.  Patient feels symptoms are improved following surgery.  Plan: Encouraged to ambulate.  Continue to progress through postoperative recovery.  Hewitt ShortsNUDELMAN,ROBERT W, MD 01/16/2018, 5:55 PM

## 2018-01-16 NOTE — Progress Notes (Signed)
[facility] Progress Note Patient Details:  Aaron Parks 27-Jan-1956 [id number]  Patient ID: Aaron FunkJohn T Frix, male   DOB: 27-Jan-1956, 62 y.o.   MRN: [id number]   Saul FordyceJennifer C Aya Geisel 01/16/2018, 3:02 PMCalled [employer] for Quick draw.

## 2018-01-16 NOTE — Op Note (Signed)
01/16/2018  1:02 PM  PATIENT:  Aaron Parks  62 y.o. male  PRE-OPERATIVE DIAGNOSIS: Lumbar stenosis with neurogenic claudication; L2-3 lumbar disc herniation; lumbar spondylosis; lumbar degenerative disc disease  POST-OPERATIVE DIAGNOSIS:  Lumbar stenosis with neurogenic claudication; L2-3 lumbar disc herniation; lumbar spondylosis; lumbar degenerative disc disease  PROCEDURE:  Procedure(s): Exposure of existing L3-4 posterior instrumentation with partial removal (removal of locking caps and rods); bilateral L2-3 lumbar decompression including laminectomy, facetectomy, and foraminotomies for the exiting L2 and L3 nerve roots bilaterally with bilateral L2-3 microdiscectomy, with decompression beyond that required for interbody arthrodesis; bilateral L2-3 posterior lumbar interbody arthrodesis with Nuvasive MAS PLIF interbody titanium implants, Vitoss BA, and infuse; bilateral L2-3 posterior lateral arthrodesis with segmental posterior instrumentation, with instrumentation at the L2-3 level tied into the existing instrumentation at the L3-4 level, Nuvasive reline MAS midline screws (shanks and heads) and rods, Vitoss BA, and infuse  SURGEON: Shirlean Kelly, MD  ASSISTANTS: Maeola Harman, MD  ANESTHESIA:   general  EBL:  Total I/O In: 2900 [I.V.:2500; Blood:150; IV Piggyback:250] Out: 885 [Urine:435; Blood:450]  BLOOD ADMINISTERED:150 CC CELLSAVER  COUNT:  Correct per nursing staff  DICTATION: Patient is brought to the operating room placed under general endotracheal anesthesia. The patient was turned to prone position the lumbar region was prepped with Betadine soap and solution and draped in a sterile fashion. The midline was infiltrated with local anesthesia with epinephrine. A localizing x-ray was taken and then a midline incision was made through the existing midline scar, and was carried down through the subcutaneous tissue, bipolar cautery and electrocautery were used to maintain  hemostasis. Dissection was carried down to the lumbar fascia. The fascia was incised bilaterally and the paraspinal muscles were dissected with a spinous process and lamina in a subperiosteal fashion. Another x-ray was taken for localization and the L2-3 level was identified.  We then exposed the existing posterior instrumentation at the L3-4 level.  It was cleared of overlying scar tissue.  The existing locking caps and rods were removed.  The dissection was then carried out laterally over the L2-3 facet complexes and the transverse processes of L2 and L3 were exposed and decorticated.  The C-arm fluoroscope was draped and brought in the field and cortical shanks were placed at the L2 level bilaterally with fluoroscopic guidance and continuous EMG monitoring.  Entry points were identified, and then we drilled through the pedicle and lateral vertebral body and a superior, lateral, ventral trajectory.  Each drill hole was examined with a ball probe and good bony surfaces were found.  Each drill hole was then tapped, again exam with a ball probe and again found to have good bony surfaces and good threading, and we placed a 5.0 x 30 mm shank on the left and a 5.0 x 35 mm shank on the right.  We then proceeded with the decompression.  An L2 and L3 decompressive lumbar laminectomy was performed using the high-speed drill and Kerrison punches. Dissection was carried out laterally including facetectomy and foraminotomies with decompression of the stenotic compression of the L2 and L3 nerve roots. Once the decompression of the stenotic compression of the thecal sac and exiting nerve roots was completed we proceeded with the posterior lumbar interbody arthrodesis. The annulus was incised bilaterally and the disc space entered. A thorough discectomy was performed using pituitary rongeurs and curettes. Once the discectomy was completed we began to prepare the endplate surfaces removing the cartilaginous endplates surface. We  then measured the height of  the intervertebral disc space. We selected 11 x 9 x 28 x 4 Nuvasive MAS PLIF interbody titanium implants.  They were packed with a combination of Vitoss BA and infuse.  We placed the first implant and on the right side, carefully retracting the thecal sac and nerve root medially. We then went back to the left side and packed the midline with additional Vitoss BA with bone marrow aspirate and infuse, and then placed a second implant and on the left side again retracting the thecal sac and nerve root medially. Additional Vitoss BA with bone marrow aspirate was packed lateral to the implants..  We then secured the heads onto the shanks and selected 5.0 x 80 mm cobalt chrome pre-lordosed rods.  They were further shaped with a rod bender, and placed within the screw heads.  There was secured with 6 locking caps.  Once all 6 locking caps were in place, final tightening was performed against a counter torque.  We then packed the lateral gutter over the transverse processes and intertransverse space with Vitoss BA with bone marrow aspirate and infuse.   The wound had been irrigated multiple times during the procedure with saline solution and bacitracin solution, good hemostasis was established with a combination of bipolar cautery and Gelfoam with thrombin. Once good hemostasis was confirmed we proceeded with closure paraspinal muscles deep fascia and Scarpa's fascia were closed with interrupted undyed 1 Vicryl sutures the subcutaneous and subcuticular closed with interrupted inverted 2-0 undyed Vicryl sutures the skin edges were approximated with Dermabond.  A dressing of sterile gauze and Hypafix was applied.  Following surgery the patient was turned back to the supine position to be reversed and the anesthetic extubated and transferred to the recovery room for further care.  PLAN OF CARE: Admit to inpatient   PATIENT DISPOSITION:  PACU - hemodynamically stable.   Delay start of  Pharmacological VTE agent (>24hrs) due to surgical blood loss or risk of bleeding:  yes

## 2018-01-16 NOTE — H&P (Signed)
Subjective: Patient is a 62 y.o. right handed white male who is admitted for treatment of L2-3 lumbar disc herniation with marked stenosis.  He is a little over 4 months status post a L3-4 lumbar decompression and posterior lateral arthrodesis as well as a left L5 hemilaminectomy microdiscectomy for free fragment disc herniation.  He was doing well following surgery when he caught his brace on a door and was jerked and twisted and he developed pain in the medial left groin and proximal medial left thigh and subsequently numbness in the left buttock, around the anus and perineum, as well as in the left testicle and left side of his penis.  He was restudied with MRI scan which showed good decompression at the L3-4 and L4-5 levels, but worsened disc herniation and stenosis at the L2-3 level.  He continues to have numbness in the left buttock and around the anus and perineum, as well as the left testicle and left side of the penis.  He describes some difficulty with voiding.  He is admitted now for L2-3 decompression including laminectomy, facetectomy, and discectomy, as well as L2-3 stabilization via posterior lumbar interbody arthrodesis with interbody implants and bone graft and extension of his posterior lateral arthrodesis to the L2-3 level with incorporation of its mentation into the existing posterior instrumentation along with bone graft.  Patient Active Problem List   Diagnosis Date Noted  . Lumbar stenosis with neurogenic claudication 09/05/2017   Past Medical History:  Diagnosis Date  . Arthritis   . Headache   . Spinal stenosis of lumbar region with neurogenic claudication     Past Surgical History:  Procedure Laterality Date  . CERVICAL SPINE SURGERY  2013  . LUMBAR MICRODISCECTOMY  2019    Medications Prior to Admission  Medication Sig Dispense Refill Last Dose  . acetaminophen (TYLENOL) 500 MG tablet Take 1,000 mg by mouth every 6 (six) hours as needed for moderate pain or headache.     01/15/2018 at Unknown time  . diclofenac (VOLTAREN) 75 MG EC tablet Take 75 mg by mouth 2 (two) times daily.   Past Week at Unknown time  . gabapentin (NEURONTIN) 300 MG capsule Take 300 mg by mouth every evening.    01/15/2018 at Unknown time  . tetrahydrozoline (VISINE) 0.05 % ophthalmic solution Place 2 drops into both eyes daily as needed (for dry eyes).    Past Month at Unknown time  . HYDROcodone-acetaminophen (NORCO/VICODIN) 5-325 MG tablet Take 1-2 tablets by mouth every 4 (four) hours as needed (pain). (Patient not taking: Reported on 12/10/2017) 40 tablet 0 Completed Course at Unknown time   No Known Allergies  Social History   Tobacco Use  . Smoking status: Never Smoker  . Smokeless tobacco: Never Used  Substance Use Topics  . Alcohol use: Yes    Alcohol/week: 6.0 standard drinks    Types: 6 Cans of beer per week    Comment: 1 beer a night    History reviewed. No pertinent family history.   Review of Systems Pertinent items noted in HPI and remainder of comprehensive ROS otherwise negative.  Objective: Vital signs in last 24 hours: Temp:  [99.1 F (37.3 C)] 99.1 F (37.3 C) (11/21 0543) Pulse Rate:  [68] 68 (11/21 0543) BP: (155)/(96) 155/96 (11/21 0543) SpO2:  [92 %] 92 % (11/21 0543) Weight:  [81.6 kg] 81.6 kg (11/21 0543)  EXAM: Patient is a well-developed well-nourished white male in no acute distress.   Lungs are clear to auscultation ,  the patient has symmetrical respiratory excursion. Heart has a regular rate and rhythm normal S1 and S2 no murmur.   Abdomen is soft nontender nondistended bowel sounds are present. Extremity examination shows no clubbing cyanosis or edema. Motor examination shows 5 over 5 strength in the lower extremities including the iliopsoas quadriceps dorsiflexor extensor hallicus  longus and plantar flexor bilaterally. Sensation is intact to pinprick in the distal lower extremities. Reflexes are symmetrical bilaterally. No pathologic reflexes  are present. Patient has a normal gait and stance.  Data Review:CBC    Component Value Date/Time   WBC 7.0 12/17/2017 1152   RBC 4.90 12/17/2017 1152   HGB 16.2 12/17/2017 1152   HCT 47.4 12/17/2017 1152   PLT 191 12/17/2017 1152   MCV 96.7 12/17/2017 1152   MCH 33.1 12/17/2017 1152   MCHC 34.2 12/17/2017 1152   RDW 12.5 12/17/2017 1152                          BMET    Component Value Date/Time   NA 138 12/17/2017 1152   K 4.2 12/17/2017 1152   CL 107 12/17/2017 1152   CO2 24 12/17/2017 1152   GLUCOSE 103 (H) 12/17/2017 1152   BUN 16 12/17/2017 1152   CREATININE 1.08 12/17/2017 1152   CALCIUM 9.5 12/17/2017 1152   GFRNONAA >60 12/17/2017 1152   GFRAA >60 12/17/2017 1152     Assessment/Plan: Patient with marked stenosis at the L2-3 level contributed to by large broad-based disc herniation who is admitted now for decompression and stabilization.  I've discussed with the patient the nature of his condition, the nature the surgical procedure, the typical length of surgery, hospital stay, and overall recuperation, the limitations postoperatively, and risks of surgery. I discussed risks including risks of infection, bleeding, possibly need for transfusion, the risk of nerve root dysfunction with pain, weakness, numbness, or paresthesias, the risk of dural tear and CSF leakage and possible need for further surgery, the risk of failure of the arthrodesis and possibly for further surgery, the risk of anesthetic complications including myocardial infarction, stroke, pneumonia, and death. We discussed the need for postoperative immobilization in a lumbar brace. Understanding all this the patient does wish to proceed with surgery and is admitted for such.   Hewitt ShortsNUDELMAN,ROBERT W, MD 01/16/2018 7:07 AM

## 2018-01-16 NOTE — Anesthesia Procedure Notes (Signed)
Procedure Name: Intubation Date/Time: 01/16/2018 7:37 AM Performed by: Lovie Cholock, Parrish Bonn K, CRNA Pre-anesthesia Checklist: Patient identified, Emergency Drugs available, Suction available and Patient being monitored Patient Re-evaluated:Patient Re-evaluated prior to induction Oxygen Delivery Method: Circle System Utilized Preoxygenation: Pre-oxygenation with 100% oxygen Induction Type: IV induction Ventilation: Mask ventilation without difficulty Laryngoscope Size: Glidescope and 4 Grade View: Grade I Tube type: Oral Tube size: 7.5 mm Number of attempts: 1 Airway Equipment and Method: Stylet and Oral airway Placement Confirmation: ETT inserted through vocal cords under direct vision,  positive ETCO2 and breath sounds checked- equal and bilateral Secured at: 22 cm Tube secured with: Tape Dental Injury: Teeth and Oropharynx as per pre-operative assessment  Comments: Soft bite block utilized

## 2018-01-17 MED FILL — Heparin Sodium (Porcine) Inj 1000 Unit/ML: INTRAMUSCULAR | Qty: 30 | Status: AC

## 2018-01-17 MED FILL — Sodium Chloride IV Soln 0.9%: INTRAVENOUS | Qty: 2000 | Status: AC

## 2018-01-17 MED FILL — Thrombin (Recombinant) For Soln 20000 Unit: CUTANEOUS | Qty: 1 | Status: AC

## 2018-01-17 NOTE — Discharge Summary (Signed)
Physician Discharge Summary  Patient ID: Aaron Parks MRN: [id number] DOB/AGE: 1955-09-21 62 y.o.  Admit date: 01/16/2018 Discharge date: 01/17/2018  Admission Diagnoses:  Lumbar stenosis with neurogenic claudication; L2-3 lumbar disc herniation; lumbar spondylosis; lumbar degenerative disc disease  Discharge Diagnoses: Lumbar stenosis with neurogenic claudication; L2-3 lumbar disc herniation; lumbar spondylosis; lumbar degenerative disc disease  Active Problems:   Lumbar stenosis with neurogenic claudication   Discharged Condition: good  Hospital Course: Patient was admitted, underwent an L2-3 lumbar decompression and arthrodesis that was tied into his existing L3-4 arthrodesis.  Postoperatively he has done well.  He is up and ambulating actively.  His incision is healing nicely.  He is voiding well.  He is asking to be discharged home.  He has been given instructions regarding wound care and activities following discharge.  He is to return for follow-up with me in 3 weeks.  Discharge Exam: Blood pressure 125/73, pulse 78, temperature 97.7 F (36.5 C), temperature source Oral, resp. rate 16, height 5\' 9"  (1.753 m), weight 81.6 kg, SpO2 97 %.  Disposition: Discharge disposition: 01-Home or Self Care       Discharge Instructions    Discharge wound care:   Complete by:  As directed    Leave the wound open to air. Shower daily with the wound uncovered. Water and soapy water should run over the incision area. Do not wash directly on the incision for 2 weeks. Remove the glue after 2 weeks.   Driving Restrictions   Complete by:  As directed    No driving for 2 weeks. May ride in the car locally now. May begin to drive locally in 2 weeks.   Other Restrictions   Complete by:  As directed    Walk gradually increasing distances out in the fresh air at least twice a day. Walking additional 6 times inside the house, gradually increasing distances, daily. No bending, lifting, or  twisting. Perform activities between shoulder and waist height (that is at counter height when standing or table height when sitting).     Allergies as of 01/17/2018   No Known Allergies     Medication List    TAKE these medications   acetaminophen 500 MG tablet Commonly known as:  TYLENOL Take 1,000 mg by mouth every 6 (six) hours as needed for moderate pain or headache.   diclofenac 75 MG EC tablet Commonly known as:  VOLTAREN Take 75 mg by mouth 2 (two) times daily.   gabapentin 300 MG capsule Commonly known as:  NEURONTIN Take 300 mg by mouth every evening.   HYDROcodone-acetaminophen 5-325 MG tablet Commonly known as:  NORCO/VICODIN Take 1-2 tablets by mouth every 4 (four) hours as needed (pain).   VISINE 0.05 % ophthalmic solution Generic drug:  tetrahydrozoline Place 2 drops into both eyes daily as needed (for dry eyes).            Discharge Care Instructions  (From admission, onward)         Start     Ordered   01/17/18 0000  Discharge wound care:    Comments:  Leave the wound open to air. Shower daily with the wound uncovered. Water and soapy water should run over the incision area. Do not wash directly on the incision for 2 weeks. Remove the glue after 2 weeks.   01/17/18 0840           Signed: Hewitt ShortsNUDELMAN,ROBERT W 01/17/2018, 8:40 AM

## 2018-01-17 NOTE — Anesthesia Postprocedure Evaluation (Signed)
Anesthesia Post Note  Patient: Jackquline DenmarkJohn T Shannon  Procedure(s) Performed: LUMBAR TWO- LUMBAR THREE DECOMPRESSION,POSTERIOR LUMBAR INTERBODY FUSION, POSTERIOR LATERAL ARTHRODESIS (N/A Back)     Patient location during evaluation: PACU Anesthesia Type: General Level of consciousness: awake and alert Pain management: pain level controlled Vital Signs Assessment: post-procedure vital signs reviewed and stable Respiratory status: spontaneous breathing, nonlabored ventilation, respiratory function stable and patient connected to nasal cannula oxygen Cardiovascular status: blood pressure returned to baseline and stable Postop Assessment: no apparent nausea or vomiting Anesthetic complications: no    Last Vitals:  Vitals:   01/16/18 2305 01/17/18 0320  BP: (!) 152/92 128/79  Pulse: 79 82  Resp: 18 18  Temp: 36.7 C 36.5 C  SpO2: 98% 95%    Last Pain:  Vitals:   01/17/18 0400  TempSrc:   PainSc: 2    Pain Goal: Patients Stated Pain Goal: 3 (01/17/18 0319)               Trevor IhaStephen A Koa Zoeller

## 2018-01-17 NOTE — Progress Notes (Signed)
Discharge instructions given to patient and wife. All questions answered. Discharged with volunteer services. Dicie BeamFrazier, Krystal Teachey RN BSN.

## 2018-12-28 IMAGING — CR DG LUMBAR SPINE 2-3V
2 series · 2 of 2 positions shown · non-contrast
Comparison: Intraoperative lateral views of the lumbar spine
09/05/2017. Preoperative MR 08/21/2017.

CLINICAL DATA: 61-year-old male for L3-4 laminectomy. Left L5
hemilaminectomy. Subsequent encounter.

EXAM:
LUMBAR SPINE - 2-3 VIEW

[xtable lateral (1 of 2)]
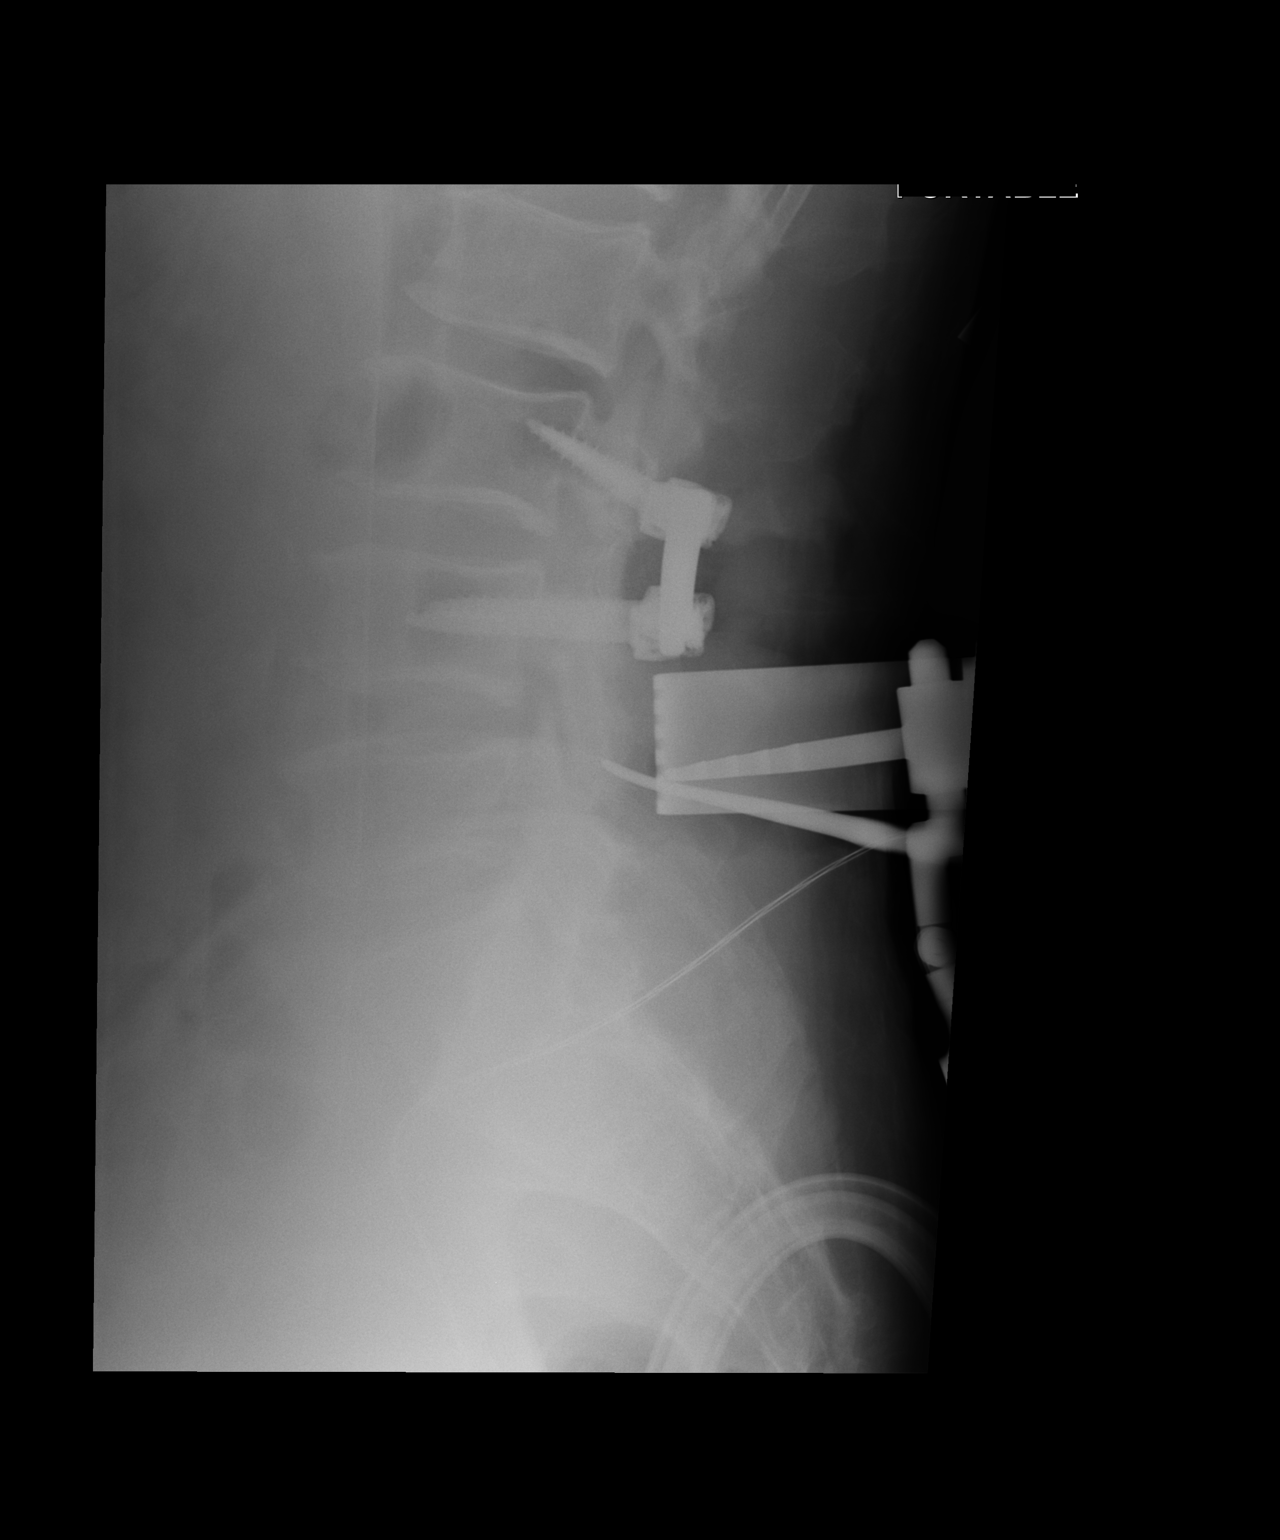

[xtable lateral (2 of 2)]
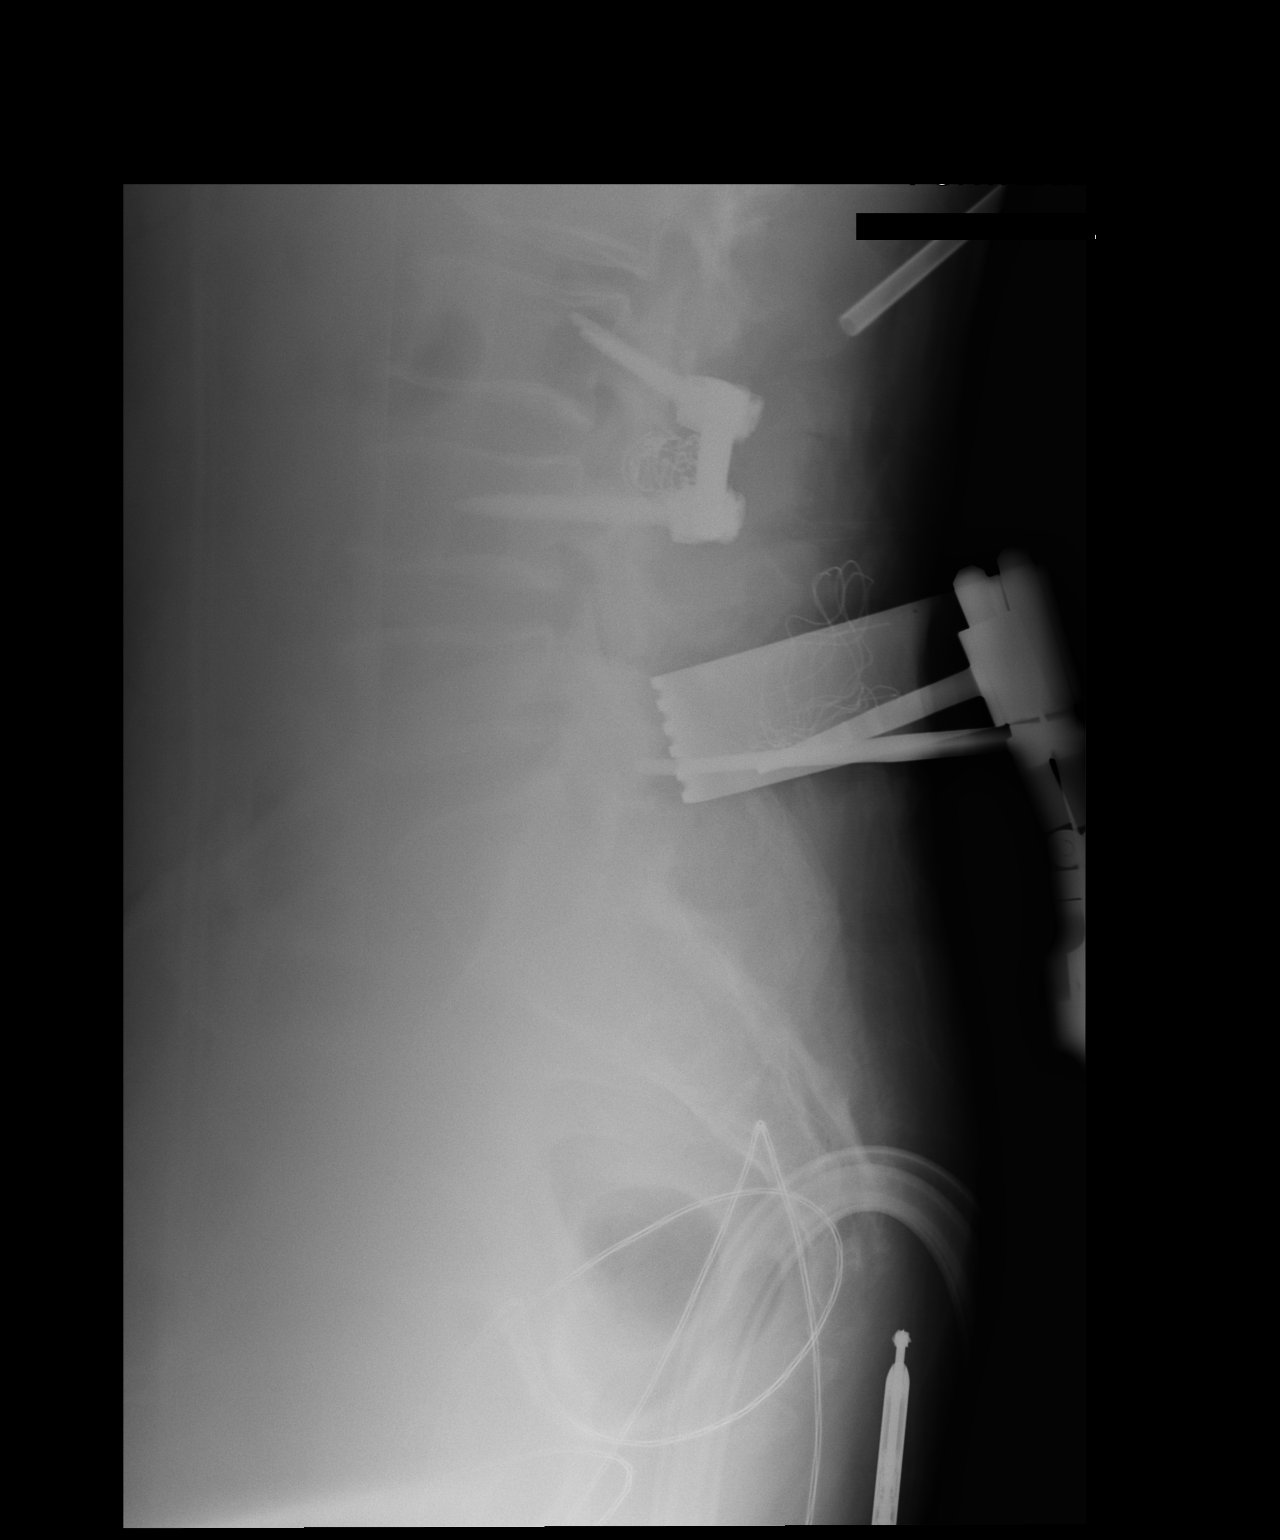

[2 of 2 positions shown; findings below may reference images not displayed]

FINDINGS: Two lateral views submitted for review after surgery.

Level assignment as on prior exams.  Rudimentary disc at S1-2 level.

Lateral view obtained [DATE] a.m. reveals fusion L3-4 with pedicle
screws and posterior connecting bar. Metallic probe directed towards
the L4-5 disc space.

Lateral view obtained [DATE] a.m. reveals metallic probe posterior to
the L5-S1 disc space. Surgical sponges are in place.
IMPRESSION: Fusion L3-4.

Localization L4-5 and L5-S1 as detailed above.

## 2021-01-05 ENCOUNTER — Other Ambulatory Visit: Payer: Self-pay | Admitting: Anesthesiology

## 2021-01-05 DIAGNOSIS — M5412 Radiculopathy, cervical region: Secondary | ICD-10-CM

## 2021-01-05 DIAGNOSIS — M961 Postlaminectomy syndrome, not elsewhere classified: Secondary | ICD-10-CM

## 2021-01-05 DIAGNOSIS — Z981 Arthrodesis status: Secondary | ICD-10-CM

## 2021-01-28 ENCOUNTER — Ambulatory Visit
Admission: RE | Admit: 2021-01-28 | Discharge: 2021-01-28 | Disposition: A | Discharge status: Home / Self Care | Payer: 59 | Source: Ambulatory Visit | Attending: Anesthesiology | Admitting: Anesthesiology

## 2021-01-28 ENCOUNTER — Other Ambulatory Visit: Payer: Self-pay

## 2021-01-28 DIAGNOSIS — M961 Postlaminectomy syndrome, not elsewhere classified: Secondary | ICD-10-CM

## 2021-01-28 DIAGNOSIS — M5412 Radiculopathy, cervical region: Secondary | ICD-10-CM

## 2021-01-28 DIAGNOSIS — Z981 Arthrodesis status: Secondary | ICD-10-CM

## 2021-03-02 ENCOUNTER — Other Ambulatory Visit: Payer: Self-pay | Admitting: Physician Assistant

## 2021-03-02 ENCOUNTER — Other Ambulatory Visit: Payer: Self-pay

## 2021-03-02 DIAGNOSIS — M25562 Pain in left knee: Secondary | ICD-10-CM

## 2021-12-08 ENCOUNTER — Other Ambulatory Visit: Payer: Self-pay | Admitting: Physician Assistant

## 2021-12-08 DIAGNOSIS — M961 Postlaminectomy syndrome, not elsewhere classified: Secondary | ICD-10-CM

## 2021-12-21 ENCOUNTER — Other Ambulatory Visit: Payer: Self-pay | Admitting: Anesthesiology

## 2021-12-24 ENCOUNTER — Other Ambulatory Visit: Payer: 59

## 2022-02-04 ENCOUNTER — Ambulatory Visit
Admission: RE | Admit: 2022-02-04 | Discharge: 2022-02-04 | Disposition: A | Discharge status: Home / Self Care | Payer: Commercial Managed Care - PPO | Source: Ambulatory Visit | Attending: Physician Assistant | Admitting: Physician Assistant

## 2022-02-04 DIAGNOSIS — M961 Postlaminectomy syndrome, not elsewhere classified: Secondary | ICD-10-CM

## 2022-02-21 ENCOUNTER — Other Ambulatory Visit: Payer: Self-pay | Admitting: Anesthesiology

## 2022-02-21 DIAGNOSIS — M961 Postlaminectomy syndrome, not elsewhere classified: Secondary | ICD-10-CM

## 2022-03-14 ENCOUNTER — Encounter: Payer: Self-pay | Admitting: Anesthesiology

## 2022-03-18 ENCOUNTER — Ambulatory Visit
Admission: RE | Admit: 2022-03-18 | Discharge: 2022-03-18 | Disposition: A | Discharge status: Home / Self Care | Payer: Commercial Managed Care - PPO | Source: Ambulatory Visit | Attending: Anesthesiology | Admitting: Anesthesiology

## 2022-03-18 DIAGNOSIS — M961 Postlaminectomy syndrome, not elsewhere classified: Secondary | ICD-10-CM

## 2022-05-22 IMAGING — MR MR LUMBAR SPINE W/O CM
4 of 5 series · 26 of 48 positions shown · non-contrast
Comparison: Lumbar spine 919

CLINICAL DATA: Severe bilateral leg pain. Off balance. Numbness
into both legs. Post-laminectomy syndrome.

EXAM:
MRI LUMBAR SPINE WITHOUT CONTRAST
TECHNIQUE: Multiplanar, multisequence MR imaging of the lumbar spine was
performed. No intravenous contrast was administered.

[Series 3: T2 · sagittal · 4.0mm · 0.55mm/px · 5 of 15 slices shown (1 of 2)]
[im 1/15]
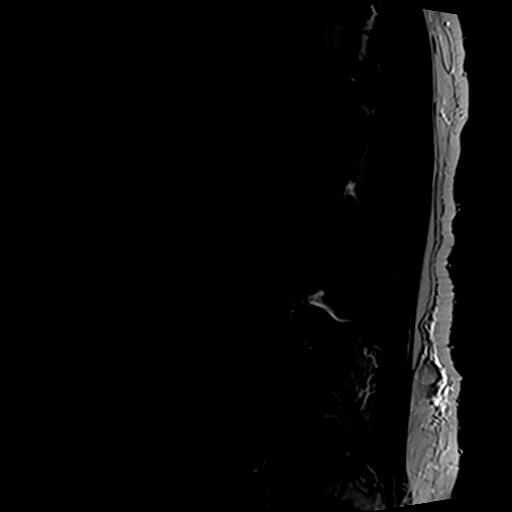
[im 4/15]
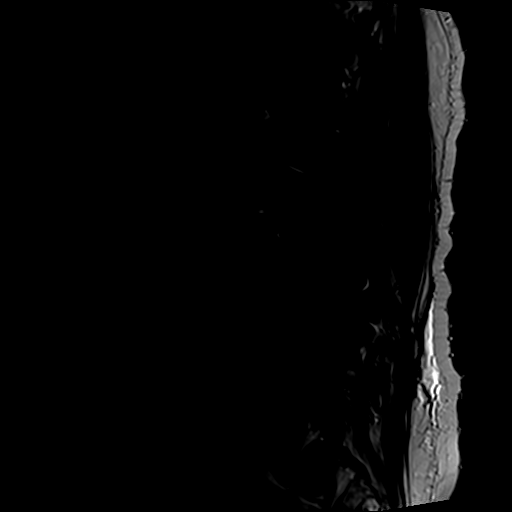
[im 8/15]
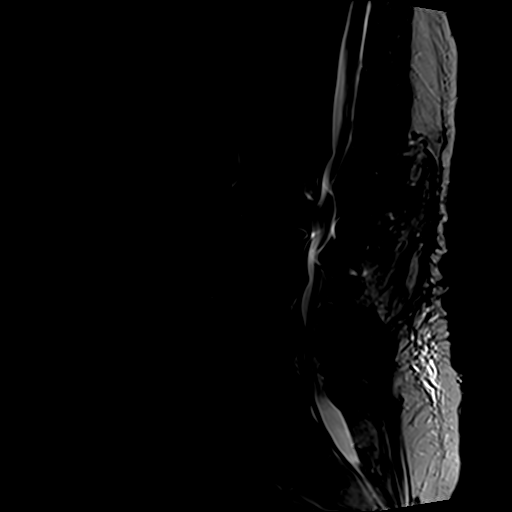
[im 11/15]
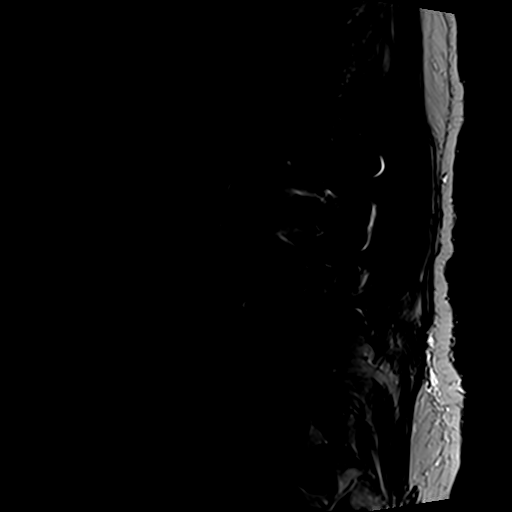
[im 15/15]
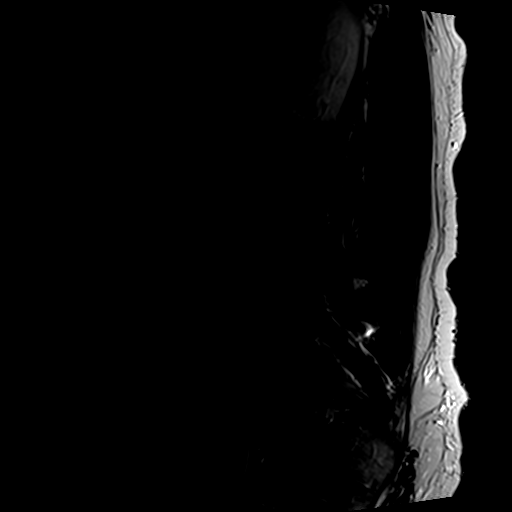

[Series 4: T1 · sagittal · 4.0mm · 0.55mm/px · 5 of 13 slices shown (1 of 2)]
[im 1/13]
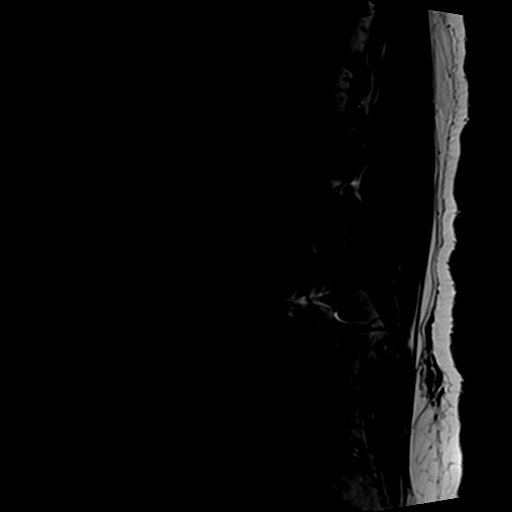
[im 4/13]
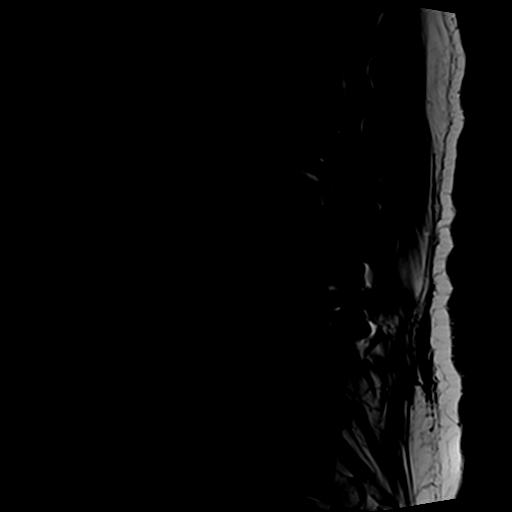
[im 7/13]
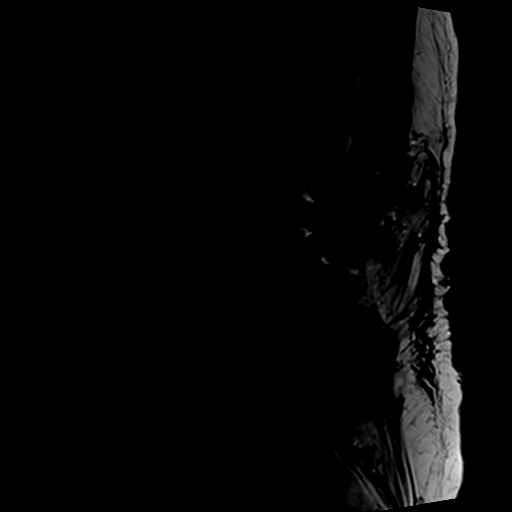
[im 10/13]
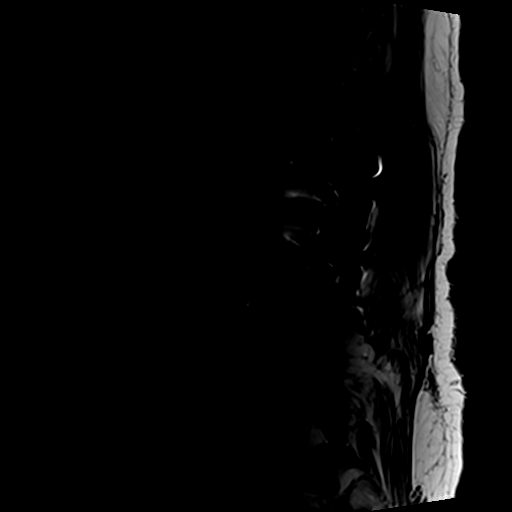
[im 13/13]
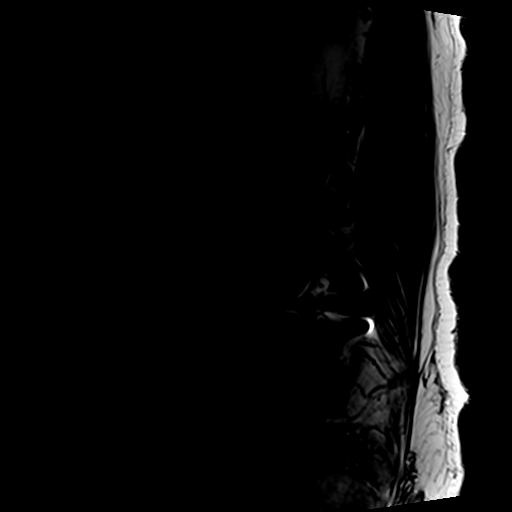

[Series 6: T2 · axial · 4.0mm · 0.70mm/px · z∈[-94,+125]mm · 10 of 41 slices shown (2 of 2)]
[im 3/41]
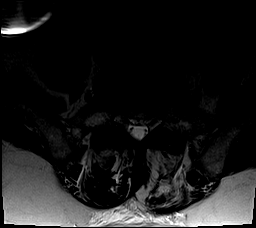
[im 6/41]
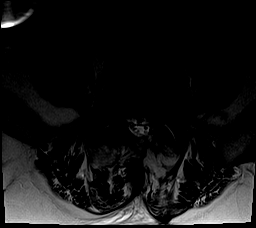
[im 9/41]
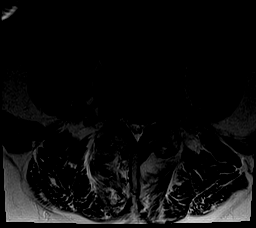
[im 14/41]
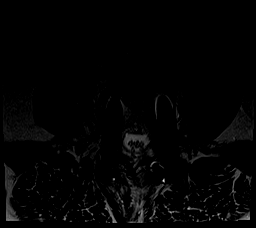
[im 19/41]
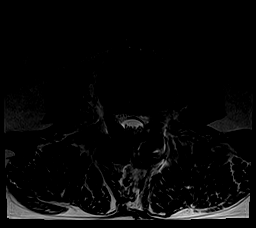
[im 22/41]
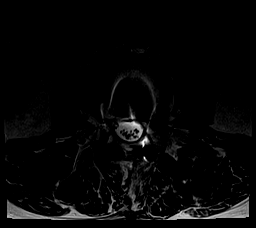
[im 25/41]
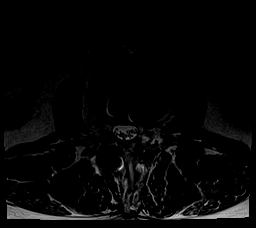
[im 30/41]
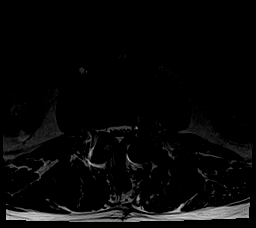
[im 35/41]
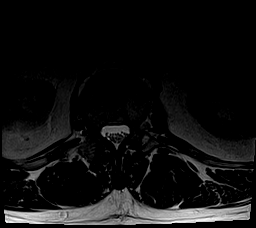
[im 41/41]
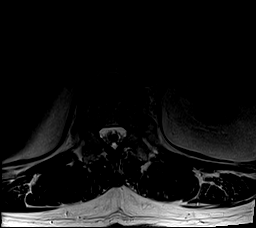

[Series 9: T1 · axial · 4.0mm · 0.35mm/px · z∈[-94,+94]mm · 6 of 41 slices shown (2 of 2)]
[im 3/41]
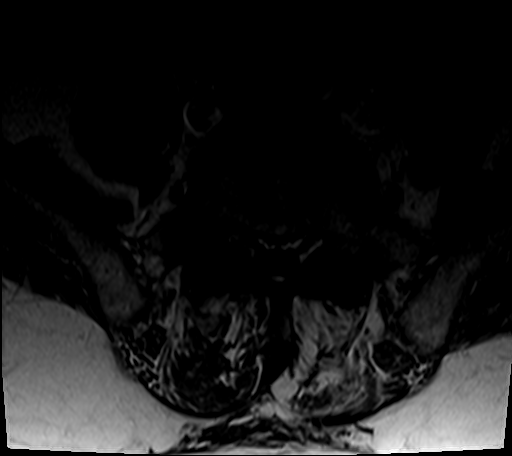
[im 6/41]
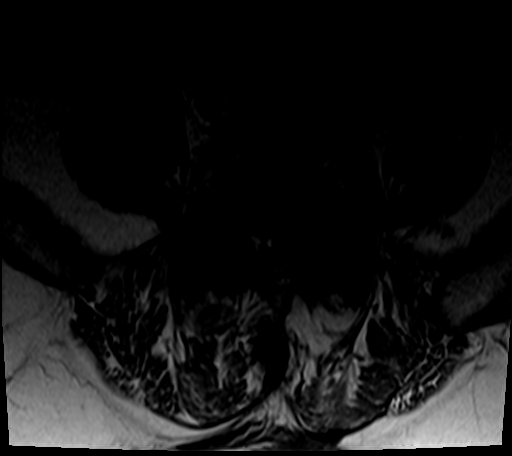
[im 9/41]
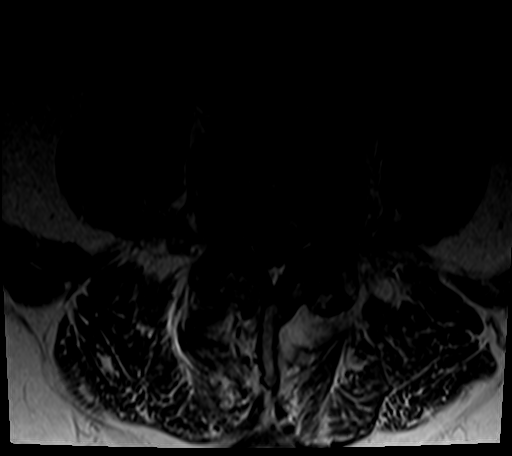
[im 14/41]
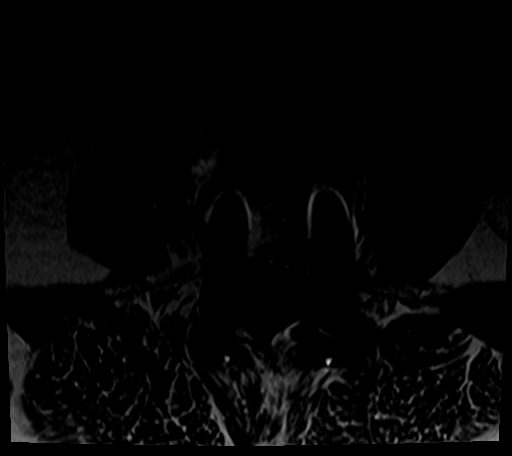
[im 22/41]
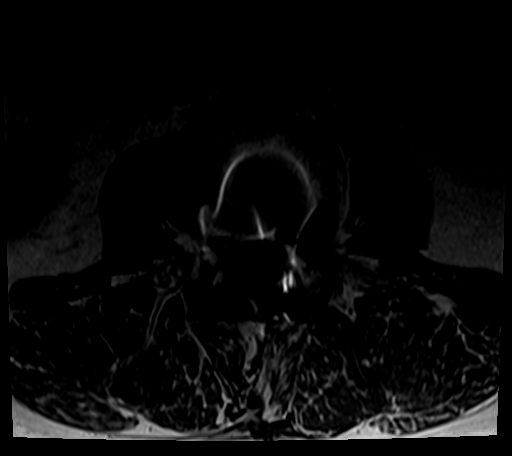
[im 35/41]
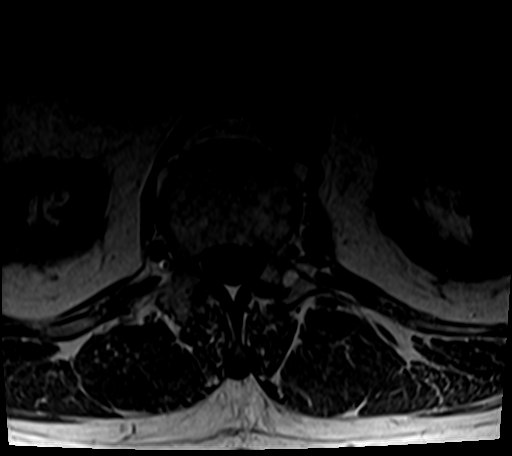

[26 of 48 positions shown; findings below may reference images not displayed]

FINDINGS: Segmentation: 5 non rib-bearing lumbar type vertebral bodies are
present. The lowest fully formed vertebral body is L5.

Alignment: Slight retrolisthesis at L1-2 is stable. Minimal
anterolisthesis at L5-S1 is stable. Straightening of normal cervical
lordosis is present. No significant interval change.

Vertebrae:  Marrow signal and vertebral body heights are stable.

Conus medullaris and cauda equina: Conus extends to the T12 level.
Conus and cauda equina appear normal.

Paraspinal and other soft tissues: Atherosclerotic changes are noted
in the aorta without aneurysm. Limited imaging the abdomen is
otherwise unremarkable.

Disc levels:

L1-2: Broad-based disc protrusion is similar the prior exam. Facet
hypertrophy is progressed slightly. Central canal is scratched at
mild left subarticular narrowing is present. Moderate foraminal
narrowing has progressed.

L2-3: Interval laminectomy. The central canal and foramina
decompressed. No residual or recurrent stenosis is present.

L3-4: Wide laminectomy again noted. Central canal is patent.
Foraminal narrowing bilaterally is stable.

L4-5: Broad-based disc protrusion present. Moderate central canal
stenosis is present. Moderate bilateral foraminal stenosis
contributes. Moderate foraminal narrowing bilaterally is stable.

L5-S1 left laminectomy noted. Residual leftward disc protrusion
extends into the foramen. Mild left subarticular narrowing is
present. Moderate to severe bilateral foraminal stenosis is similar
to the prior exams.

L2-3: Interval discectomy
IMPRESSION: 1. Interval discectomy at L2-3 with decompression of the central
canal and foramina.
2. Slight progression of moderate foraminal narrowing bilaterally at
L1-2 secondary to progressive facet hypertrophy with adjacent level
disease.
3. Moderate central and bilateral foraminal stenosis at L4-5 is
stable.
4. Mild left subarticular narrowing at L5-S1 following left
laminectomy.
5. Moderate to severe bilateral foraminal stenosis at L5-S1 is
similar to the prior exams.
6. Wide laminectomy at L3-4 without residual or recurrent stenosis.
